# Patient Record
Sex: Female | Born: 1950 | Race: White | Hispanic: No | Marital: Married | Smoking: Never smoker
Health system: Southern US, Community
[De-identification: ages and names within clinical notes are randomized; demographics above are authoritative.]

## PROBLEM LIST (undated history)

## (undated) DIAGNOSIS — E559 Vitamin D deficiency, unspecified: Secondary | ICD-10-CM

## (undated) DIAGNOSIS — E785 Hyperlipidemia, unspecified: Secondary | ICD-10-CM

## (undated) DIAGNOSIS — I10 Essential (primary) hypertension: Secondary | ICD-10-CM

## (undated) DIAGNOSIS — K635 Polyp of colon: Secondary | ICD-10-CM

## (undated) DIAGNOSIS — F32A Depression, unspecified: Secondary | ICD-10-CM

## (undated) DIAGNOSIS — F39 Unspecified mood [affective] disorder: Secondary | ICD-10-CM

## (undated) DIAGNOSIS — R3129 Other microscopic hematuria: Secondary | ICD-10-CM

## (undated) DIAGNOSIS — Z8669 Personal history of other diseases of the nervous system and sense organs: Secondary | ICD-10-CM

## (undated) DIAGNOSIS — J45909 Unspecified asthma, uncomplicated: Secondary | ICD-10-CM

## (undated) DIAGNOSIS — G609 Hereditary and idiopathic neuropathy, unspecified: Secondary | ICD-10-CM

## (undated) DIAGNOSIS — G4730 Sleep apnea, unspecified: Secondary | ICD-10-CM

## (undated) DIAGNOSIS — M1990 Unspecified osteoarthritis, unspecified site: Secondary | ICD-10-CM

## (undated) DIAGNOSIS — E119 Type 2 diabetes mellitus without complications: Secondary | ICD-10-CM

## (undated) DIAGNOSIS — Z87898 Personal history of other specified conditions: Secondary | ICD-10-CM

## (undated) DIAGNOSIS — G43909 Migraine, unspecified, not intractable, without status migrainosus: Secondary | ICD-10-CM

## (undated) DIAGNOSIS — J309 Allergic rhinitis, unspecified: Secondary | ICD-10-CM

## (undated) DIAGNOSIS — D649 Anemia, unspecified: Secondary | ICD-10-CM

## (undated) DIAGNOSIS — F329 Major depressive disorder, single episode, unspecified: Secondary | ICD-10-CM

## (undated) HISTORY — PX: ABDOMINAL HYSTERECTOMY: SHX81

## (undated) HISTORY — DX: Depression, unspecified: F32.A

## (undated) HISTORY — DX: Major depressive disorder, single episode, unspecified: F32.9

## (undated) HISTORY — DX: Essential (primary) hypertension: I10

## (undated) HISTORY — DX: Hyperlipidemia, unspecified: E78.5

## (undated) HISTORY — DX: Type 2 diabetes mellitus without complications: E11.9

## (undated) HISTORY — DX: Unspecified asthma, uncomplicated: J45.909

## (undated) HISTORY — PX: CARPAL TUNNEL RELEASE: SHX101

## (undated) HISTORY — DX: Sleep apnea, unspecified: G47.30

## (undated) HISTORY — DX: Unspecified osteoarthritis, unspecified site: M19.90

## (undated) HISTORY — PX: CATARACT EXTRACTION: SUR2

---

## 2004-06-12 ENCOUNTER — Ambulatory Visit: Payer: Self-pay | Admitting: Obstetrics and Gynecology

## 2005-04-06 ENCOUNTER — Ambulatory Visit: Payer: Self-pay | Admitting: Unknown Physician Specialty

## 2005-06-20 ENCOUNTER — Ambulatory Visit: Payer: Self-pay | Admitting: Ophthalmology

## 2005-07-02 ENCOUNTER — Ambulatory Visit: Payer: Self-pay | Admitting: Obstetrics and Gynecology

## 2005-10-16 ENCOUNTER — Ambulatory Visit: Payer: Self-pay | Admitting: Unknown Physician Specialty

## 2005-10-16 HISTORY — PX: COLONOSCOPY: SHX174

## 2005-12-26 ENCOUNTER — Ambulatory Visit: Payer: Self-pay | Admitting: Internal Medicine

## 2006-03-06 ENCOUNTER — Ambulatory Visit: Payer: Self-pay | Admitting: Internal Medicine

## 2006-07-04 ENCOUNTER — Ambulatory Visit: Payer: Self-pay | Admitting: Obstetrics and Gynecology

## 2007-05-26 ENCOUNTER — Ambulatory Visit: Payer: Self-pay | Admitting: Internal Medicine

## 2007-09-04 ENCOUNTER — Ambulatory Visit: Payer: Self-pay | Admitting: Obstetrics and Gynecology

## 2008-09-08 ENCOUNTER — Ambulatory Visit: Payer: Self-pay | Admitting: Obstetrics and Gynecology

## 2009-04-21 ENCOUNTER — Ambulatory Visit: Payer: Self-pay | Admitting: Neurology

## 2009-05-06 ENCOUNTER — Ambulatory Visit: Payer: Self-pay | Admitting: Obstetrics and Gynecology

## 2009-05-09 ENCOUNTER — Inpatient Hospital Stay: Payer: Self-pay | Admitting: Obstetrics and Gynecology

## 2009-09-15 ENCOUNTER — Ambulatory Visit: Payer: Self-pay | Admitting: Obstetrics and Gynecology

## 2010-03-06 ENCOUNTER — Ambulatory Visit: Payer: Self-pay | Admitting: Unknown Physician Specialty

## 2010-05-17 ENCOUNTER — Ambulatory Visit: Payer: Self-pay | Admitting: Ophthalmology

## 2010-05-29 ENCOUNTER — Ambulatory Visit: Payer: Self-pay | Admitting: Ophthalmology

## 2010-12-19 ENCOUNTER — Ambulatory Visit: Payer: Self-pay | Admitting: Obstetrics and Gynecology

## 2011-02-12 ENCOUNTER — Emergency Department: Payer: Self-pay | Admitting: Emergency Medicine

## 2011-07-12 LAB — COMPREHENSIVE METABOLIC PANEL
Albumin: 3.9 g/dL (ref 3.4–5.0)
Anion Gap: 15 (ref 7–16)
BUN: 18 mg/dL (ref 7–18)
Bilirubin,Total: 0.3 mg/dL (ref 0.2–1.0)
Calcium, Total: 9.6 mg/dL (ref 8.5–10.1)
Co2: 25 mmol/L (ref 21–32)
Creatinine: 0.87 mg/dL (ref 0.60–1.30)
EGFR (African American): >60
Glucose: 102 mg/dL — ABNORMAL HIGH (ref 65–99)
Osmolality: 289 (ref 275–301)
Potassium: 3.4 mmol/L — ABNORMAL LOW (ref 3.5–5.1)
SGPT (ALT): 20 U/L
Sodium: 144 mmol/L (ref 136–145)
Total Protein: 7.6 g/dL (ref 6.4–8.2)

## 2011-07-12 LAB — CBC
MCH: 31 pg (ref 26.0–34.0)
MCHC: 33.4 g/dL (ref 32.0–36.0)
Platelet: 200 10*3/uL (ref 150–440)
RBC: 4.27 10*6/uL (ref 3.80–5.20)

## 2011-07-12 LAB — TROPONIN I: Troponin-I: <0.02 ng/mL

## 2011-07-13 ENCOUNTER — Observation Stay: Payer: Self-pay | Admitting: Internal Medicine

## 2011-07-13 LAB — TROPONIN I
Troponin-I: <0.02 ng/mL
Troponin-I: <0.02 ng/mL

## 2012-02-22 ENCOUNTER — Ambulatory Visit: Payer: Self-pay | Admitting: Internal Medicine

## 2012-03-14 ENCOUNTER — Ambulatory Visit: Payer: Self-pay | Admitting: Internal Medicine

## 2012-04-03 ENCOUNTER — Ambulatory Visit: Payer: Self-pay | Admitting: Obstetrics and Gynecology

## 2013-05-29 ENCOUNTER — Ambulatory Visit: Payer: Self-pay | Admitting: Obstetrics and Gynecology

## 2014-03-09 DIAGNOSIS — E118 Type 2 diabetes mellitus with unspecified complications: Secondary | ICD-10-CM | POA: Insufficient documentation

## 2014-03-09 DIAGNOSIS — E7800 Pure hypercholesterolemia, unspecified: Secondary | ICD-10-CM | POA: Insufficient documentation

## 2014-03-09 DIAGNOSIS — I10 Essential (primary) hypertension: Secondary | ICD-10-CM | POA: Insufficient documentation

## 2014-08-15 NOTE — H&P (Signed)
PATIENT NAME:  Desiree Ramirez, Desiree Ramirez MR#:  [id number] DATE OF BIRTH:  [birth year]  DATE OF ADMISSION:  07/13/2011  PRIMARY CARE PHYSICIAN: Dr. Daniel Nones.   CHIEF COMPLAINT: Chest pain.   HISTORY OF PRESENT ILLNESS: [name] is a 64 year old pleasant Caucasian female with a history of systemic hypertension, hypercholesterolemia and diabetes mellitus. The patient was in her usual state of health until about a week ago when she started to feel some chest discomfort upon exertion, but was mild and brief. She did not pay much attention until the day of presentation to the Emergency Department stating that she was carrying her grandchild and walking in order that was called and at that point, she felt shortness of breath and midsternal chest pain described as constant feeling of squeezing-like or cramping, the severity about 7 in a scale of 10. This was not associated with any sweating, nausea or vomiting. The duration of the pain was about four hours and started to ease off gradually and then subsided by the time she was being evaluated at the Emergency Department. There was no associated syncope or near syncope. Given the patient's risk factors and also strong family history of premature coronary artery disease, she was admitted for observation and to follow-up on her cardiac enzymes.   REVIEW OF SYSTEMS: CONSTITUTIONAL: Denies any fever. No chills. No night sweats. No fatigue. EYES: No blurring of vision. No double vision. ENT: No hearing impairment. No sore throat. No dysphagia. CARDIOVASCULAR: Admits having chest pain and shortness of breath. No edema. No syncope. RESPIRATORY: No cough. No sputum production, but she admits the chest pain and the shortness of breath. GASTROINTESTINAL: No abdominal pain. No nausea. No vomiting. No diarrhea. GENITOURINARY: No dysuria or frequency of urination. No vaginal bleed. MUSCULOSKELETAL: No joint swelling or pain. No muscular pain or swelling. INTEGUMENTARY: No skin rash.  No ulcers. NEUROLOGY: No focal weakness. No seizure activity. No headache. PSYCHIATRY: Currently, she does not have anxiety or depression, but she has by history. ENDOCRINE: No night sweats. No polyuria or polydipsia. No heat or cold intolerance. HEMATOLOGY: No easy bruisability. No lymphadenopathies.   PAST MEDICAL HISTORY:  1. History of systemic hypertension.  2. History of hypercholesterolemia.  3. History of asthma.  4. Diabetes mellitus, type II.  5. History of anxiety.  6. History of diverticulosis and colon polyps.   PAST SURGICAL HISTORY:  1. History of hysterectomy in 2011.  2. History of colonoscopy in 2011.  3. History of bilateral cataract surgery.   FAMILY HISTORY: Her mother died at the age of 52 from heart attack. She has a brother who had a heart attack at age of 70.   SOCIAL HISTORY: She is married, living with her husband. She worked at [employer], then she was laid off ann now she is unemployed.   SOCIAL HABITS: Nonsmoker. She drinks alcohol only socially.   ADMISSION MEDICATIONS:  1. Seroquel 25 mg at night.  2. Clonazepam 0.5 mg twice a day p.r.n.  3. Simvastatin 80 milligrams once a day.  4. Sertraline or Zoloft 100 mg a day.  5. Vitamin D3 2000 units once a day.  6. ProAir HFA inhaler p.r.n.  7. Advair Diskus 250/50 twice a day. 8. Metformin 500 mg once a day. 9. Losartan/hydrochlorothiazide 50/12.5 once a day.  10. Estradiol 2 mg once a day.   ALLERGIES: Trimox causing rash and hives. The patient reported that she has no allergy to penicillin and she had tolerated that well in the  past.   PHYSICAL EXAMINATION:  VITAL SIGNS: Blood pressure 154/79, respiratory rate 20, pulse 58, temperature 97.6, and oxygen saturation is 99% on oxygen.   GENERAL APPEARANCE: Middle-aged female lying in bed in no acute distress.   HEAD AND NECK: No pallor. No icterus. No cyanosis.   ENT: Hearing was normal. Nasal mucosa, lips, tongue were normal.   EYES:  Normal iris and conjunctivae. Pupils about 4 mm, equal and sluggishly reactive to light.   NECK: Supple. Trachea at midline. No thyromegaly. No cervical lymphadenopathy.   HEART: Normal S1, S2. No S3, S4. No murmur. No gallop. No carotid bruits.   RESPIRATORY: Normal breathing pattern without use of accessory muscles. No rales. No wheezing.   ABDOMEN: Soft without tenderness. No hepatosplenomegaly. No masses. No hernias.   MUSCULOSKELETAL: No joint swelling. No clubbing.   NEUROLOGIC: Cranial nerves II through XII are intact. No focal motor deficit.   PSYCHIATRY: The patient is alert and oriented x3. Mood and affect were normal.   LABORATORY, DIAGNOSTIC, AND RADIOLOGICAL DATA: EKG showed normal sinus rhythm at rate of 60 per minute. Nonspecific T and ST abnormalities. Incomplete right bundle branch block. Otherwise unremarkable EKG. Serum glucose 102, BUN 18, creatinine 0.8, sodium 144, potassium 3.4. Her liver function tests were normal. Troponin less than 0.02. CBC showed white count of 8,000, hemoglobin 13, hematocrit 39, platelet count 200. Chest x-ray showed heart size was normal. No consolidation, no effusion.   ASSESSMENT:  1. Chest pain, for evaluation. This is suspicious for unstable angina. With multiple risk factors including also strong family history.  2. Systemic hypertension.  3. Hypercholesterolemia.  4. Diabetes mellitus, type II.  5. History of asthma.  6. History of anxiety and depression.  7. Mild hypokalemia.   PLAN:  1. The patient will be admitted for observation on telemetry.  2. Follow-up on cardiac enzymes.  3. Continue medications as listed above and add aspirin 325 mg a day.  4. Sublingual nitroglycerin p.r.n.  5. Schedule for nuclear stress test.  6. One dose of potassium chloride 40 mEq x1 to correct the mild hypokalemia.   TIME NEEDED TO EVALUATE THIS PATIENT: More than 50 minutes.   ____________________________ Carney CornersAmir M. Rudene Rearwish,  MD amd:ap D: 07/13/2011 01:52:37 ET T: 07/13/2011 07:04:39 ET JOB#: [id number]  cc: Carney CornersAmir M. Rudene Rearwish, MD, <Dictator> Lynnea FerrierBert J. Klein III, MD Karolee OhsAMIR Dala DockM Qasim Diveley MD ELECTRONICALLY SIGNED 07/13/2011 22:14

## 2015-04-01 ENCOUNTER — Encounter: Payer: BLUE CROSS/BLUE SHIELD | Attending: Surgery | Admitting: Surgery

## 2015-04-01 DIAGNOSIS — M1990 Unspecified osteoarthritis, unspecified site: Secondary | ICD-10-CM | POA: Insufficient documentation

## 2015-04-01 DIAGNOSIS — E11622 Type 2 diabetes mellitus with other skin ulcer: Secondary | ICD-10-CM | POA: Diagnosis present

## 2015-04-01 DIAGNOSIS — B354 Tinea corporis: Secondary | ICD-10-CM | POA: Insufficient documentation

## 2015-04-01 DIAGNOSIS — Z87891 Personal history of nicotine dependence: Secondary | ICD-10-CM | POA: Diagnosis not present

## 2015-04-01 DIAGNOSIS — F329 Major depressive disorder, single episode, unspecified: Secondary | ICD-10-CM | POA: Insufficient documentation

## 2015-04-01 DIAGNOSIS — I10 Essential (primary) hypertension: Secondary | ICD-10-CM | POA: Insufficient documentation

## 2015-04-01 DIAGNOSIS — F419 Anxiety disorder, unspecified: Secondary | ICD-10-CM | POA: Insufficient documentation

## 2015-04-01 DIAGNOSIS — L97222 Non-pressure chronic ulcer of left calf with fat layer exposed: Secondary | ICD-10-CM | POA: Insufficient documentation

## 2015-04-01 DIAGNOSIS — J45909 Unspecified asthma, uncomplicated: Secondary | ICD-10-CM | POA: Diagnosis not present

## 2015-04-01 DIAGNOSIS — G4730 Sleep apnea, unspecified: Secondary | ICD-10-CM | POA: Insufficient documentation

## 2015-04-02 NOTE — Progress Notes (Addendum)
Desiree Ramirez, Desiree Ramirez ([id number]) Visit Report for 04/01/2015 Chief Complaint Document Details Patient Name: Desiree Ramirez, Desiree Ramirez 04/01/2015 10:15 Date of Service: AM Medical Record [id number] Number: Patient Account Number: [id number] May 28, 1950 (63 y.o. Treating RN: Phillis Haggis Date of Birth/Sex: Female) Other Clinician: Primary Care Physician: Desiree Ramirez Nones Treating Evlyn Kanner Referring Physician: RATCLIFFE, HEATHER Physician/Extender: Tania Ade in Treatment: 0 Information Obtained from: Patient Chief Complaint Patient presents to the wound care center for a consult due non healing wound to the left lower extremity which she's had for about 6 months now. Electronic Signature(s) Signed: 04/01/2015 12:03:53 PM By: Evlyn Kanner MD, FACS Entered By: Evlyn Kanner on 04/01/2015 12:03:53 Desiree Ramirez ([id number]) -------------------------------------------------------------------------------- HPI Details Patient Name: Desiree Ramirez, Desiree Ramirez 04/01/2015 10:15 Date of Service: AM Medical Record [id number] Number: Patient Account Number: [id number] [birth year] (64 y.o. Treating RN: Phillis Haggis Date of Birth/Sex: Female) Other Clinician: Primary Care Physician: Desiree Ramirez Nones Treating Evlyn Kanner Referring Physician: RATCLIFFE, HEATHER Physician/Extender: Weeks in Treatment: 0 History of Present Illness Location: left lower extremity Quality: Patient reports No Pain. Severity: Patient states wound are getting worse. Duration: Patient has had the wound for > 5 months prior to seeking treatment at the wound center Context: The wound appeared gradually over time. she thought initially she had a bump and a slight abrasion in this area. Modifying Factors: Other treatment(s) tried include:was treated with Bactrim and also been treated with Diflucan orally for a fungal infection in a perineural region Associated Signs and Symptoms: the patient says she has a lot of itching and scratching  around her perineal region. HPI Description: 64 year old patient who is known to have diabetes mellitus for several years now comes with a history of having a blunt injury to her left lower extremity in June 2016. The wound never healed and she continues to have local problems. She was seen by her PCP recently and given symptomatic treatment with Bactrim DS and was also being treated for candidiasis her perineal region. Under medical care the wound progressed to have further issues and hence she was referred to [place]. Electronic Signature(s) Signed: 04/01/2015 12:05:22 PM By: Evlyn Kanner MD, FACS Previous Signature: 04/01/2015 10:59:41 AM Version By: Evlyn Kanner MD, FACS Entered By: Evlyn Kanner on 04/01/2015 12:05:22 Desiree Ramirez ([id number]) -------------------------------------------------------------------------------- Physical Exam Details Patient Name: Desiree Ramirez, Desiree Ramirez 04/01/2015 10:15 Date of Service: AM Medical Record [id number] Number: Patient Account Number: [id number] 20-Jul-1950 (64 y.o. Treating RN: Phillis Haggis Date of Birth/Sex: Female) Other Clinician: Primary Care Physician: Desiree Ramirez Nones Treating Desiree Ramirez Referring Physician: RATCLIFFE, HEATHER Physician/Extender: Weeks in Treatment: 0 Constitutional . Pulse regular. Respirations normal and unlabored. Afebrile. . Eyes Nonicteric. Reactive to light. Ears, Nose, Mouth, and Throat Lips, teeth, and gums WNL.Marland Kitchen Moist mucosa without lesions . Neck supple and nontender. No palpable supraclavicular or cervical adenopathy. Normal sized without goiter. Respiratory WNL. No retractions.. Cardiovascular Pedal Pulses WNL. her ABI was 1.01. No clubbing, cyanosis or edema. Lymphatic No adneopathy. No adenopathy. No adenopathy. Musculoskeletal Adexa without tenderness or enlargement.. Digits and nails w/o clubbing, cyanosis, infection, petechiae, ischemia, or inflammatory conditions.. Integumentary (Hair,  Skin) No suspicious lesions. No crepitus or fluctuance. No peri-wound warmth or erythema. No masses.Marland Kitchen Psychiatric Judgement and insight Intact.. No evidence of depression, anxiety, or agitation.. Notes on the left lower extremity in the region of fashion she has a circular lesion which is typical for tinea corporis. I do not find any open ulceration Electronic Signature(s) Signed: 04/01/2015 12:08:53 PM By: Evlyn Kanner MD, FACS Previous Signature: 04/01/2015  12:06:01 PM Version By: Evlyn KannerBritto, Cleotis Sparr MD, FACS Entered By: Evlyn KannerBritto, Kellis Topete on 04/01/2015 12:08:52 Desiree Ramirez, Desiree M. ([id number]) -------------------------------------------------------------------------------- Physician Orders Details Patient Name: Desiree Ramirez, Desiree M. 04/01/2015 10:15 Date of Service: AM Medical Record [id number] Number: Patient Account Number: [id number] October 19, 1950 (64 y.o. Treating RN: Phillis HaggisPinkerton, Desiree Ramirez Date of Birth/Sex: Female) Other Clinician: Primary Care Physician: Desiree Ramirez NonesKLEIN, BERT Treating Evlyn KannerBritto, Antonea Gaut Referring Physician: RATCLIFFE, HEATHER Physician/Extender: Tania AdeWeeks in Treatment: 0 Verbal / Phone Orders: Yes ClinicianAshok Cordia: Desiree Ramirez, Desiree Ramirez Read Back and Verified: Yes Diagnosis Coding ICD-10 Coding Code Description E11.622 Type 2 diabetes mellitus with other skin ulcer L97.222 Non-pressure chronic ulcer of left calf with fat layer exposed Wound Cleansing Wound #1 Left,Anterior Lower Leg o Clean wound with Normal Saline. Anesthetic Wound #1 Left,Anterior Lower Leg o Topical Lidocaine 4% cream applied to wound bed prior to debridement Follow-up Appointments Wound #1 Left,Anterior Lower Leg o Return Appointment in 1 week. Patient Medications Allergies: amocicillin Notifications Medication Indication Start End Lamisil AT 04/01/2015 DOSE topical 1 % cream - cream topical apply once daily Electronic Signature(s) Signed: 04/01/2015 12:02:33 PM By: Evlyn KannerBritto, Amirr Achord MD, FACS Entered By: Evlyn KannerBritto, Anes Rigel on  04/01/2015 12:02:32 Desiree Ramirez, Desiree M. ([id number]) -------------------------------------------------------------------------------- Problem List Details Patient Name: Desiree Ramirez, Desiree M. 04/01/2015 10:15 Date of Service: AM Medical Record [id number] Number: Patient Account Number: [id number] October 19, 1950 (64 y.o. Treating RN: Phillis HaggisPinkerton, Desiree Ramirez Date of Birth/Sex: Female) Other Clinician: Primary Care Physician: Desiree Ramirez NonesKLEIN, BERT Treating Evlyn KannerBritto, Rhiannan Kievit Referring Physician: RATCLIFFE, HEATHER Physician/Extender: Tania AdeWeeks in Treatment: 0 Active Problems ICD-10 Encounter Code Description Active Date Diagnosis E11.622 Type 2 diabetes mellitus with other skin ulcer 04/01/2015 Yes B35.4 Tinea corporis 04/01/2015 Yes Inactive Problems Resolved Problems Electronic Signature(s) Signed: 04/01/2015 12:03:29 PM By: Evlyn KannerBritto, Kahli Fitzgerald MD, FACS Previous Signature: 04/01/2015 11:00:31 AM Version By: Evlyn KannerBritto, Gray Doering MD, FACS Entered By: Evlyn KannerBritto, Darothy Courtright on 04/01/2015 12:03:29 Desiree Ramirez, Pennelope M. ([id number]) -------------------------------------------------------------------------------- Progress Note Details Patient Name: Desiree Ramirez, Desiree Ramirez M. 04/01/2015 10:15 Date of Service: AM Medical Record [id number] Number: Patient Account Number: [id number] October 19, 1950 (64 y.o. Treating RN: Phillis HaggisPinkerton, Desiree Ramirez Date of Birth/Sex: Female) Other Clinician: Primary Care Physician: Desiree Ramirez NonesKLEIN, BERT Treating Evlyn KannerBritto, Shanda Cadotte Referring Physician: RATCLIFFE, HEATHER Physician/Extender: Tania AdeWeeks in Treatment: 0 Subjective Chief Complaint Information obtained from Patient Patient presents to the wound care center for a consult due non healing wound to the left lower extremity which she's had for about 6 months now. History of Present Illness (HPI) The following HPI elements were documented for the patient's wound: Location: left lower extremity Quality: Patient reports No Pain. Severity: Patient states wound are getting worse. Duration: Patient  has had the wound for > 5 months prior to seeking treatment at the wound center Context: The wound appeared gradually over time. she thought initially she had a bump and a slight abrasion in this area. Modifying Factors: Other treatment(s) tried include:was treated with Bactrim and also been treated with Diflucan orally for a fungal infection in a perineural region Associated Signs and Symptoms: the patient says she has a lot of itching and scratching around her perineal region. 64 year old patient who is known to have diabetes mellitus for several years now comes with a history of having a blunt injury to her left lower extremity in June 2016. The wound never healed and she continues to have local problems. She was seen by her PCP recently and given symptomatic treatment with Bactrim DS and was also being treated for candidiasis her perineal region. Under medical care the wound progressed to have further issues and hence she was referred to us. Wound  History Patient presents with 1 open wound that has been present for approximately 6 months. Patient has been treating wound in the following manner: bactine, abt oint, castor oil. Laboratory tests have not been performed in the last month. Patient reportedly has not tested positive for an antibiotic resistant organism. Patient reportedly has not tested positive for osteomyelitis. Patient reportedly has not had testing performed to evaluate circulation in the legs. Patient experiences the following problems associated with their wounds: swelling. Patient History Information obtained from Patient. DARCEL, ZICK ([id number]) Allergies amocicillin (Severity: Severe, Reaction: hives) Family History Cancer - Siblings, Paternal Grandparents, Maternal Grandparents, Diabetes - Mother, Siblings, Heart Disease - Mother, Siblings, Hypertension - Mother, Mother, Father, Siblings, Lung Disease - Maternal Grandparents, Stroke - Maternal Grandparents,  Thyroid Problems - Child, No family history of Hereditary Spherocytosis, Kidney Disease, Seizures, Tuberculosis. Social History Former smoker - 6 months, Marital Status - Married, Alcohol Use - Rarely, Drug Use - No History, Caffeine Use - Daily. Medical History Eyes Patient has history of Cataracts - surgery Denies history of Glaucoma Respiratory Patient has history of Asthma, Sleep Apnea Cardiovascular Patient has history of Hypertension Endocrine Patient has history of Type II Diabetes Musculoskeletal Patient has history of Osteoarthritis Patient is treated with Oral Agents. Blood sugar is not tested. Medical And Surgical History Notes Genitourinary recurrent UTIs cyctitis Psychiatric depression Review of Systems (ROS) Eyes Complains or has symptoms of Glasses / Contacts - glasses. Ear/Nose/Mouth/Throat The patient has no complaints or symptoms. Hematologic/Lymphatic The patient has no complaints or symptoms. Respiratory The patient has no complaints or symptoms. Gastrointestinal The patient has no complaints or symptoms. Immunological Complains or has symptoms of Hives, Itching. Integumentary (Skin) Complains or has symptoms of Wounds, Swelling. Neurologic SOFHIA, ULIBARRI ([id number]) The patient has no complaints or symptoms. Oncologic The patient has no complaints or symptoms. Psychiatric Complains or has symptoms of Anxiety. Medications losartan 50 mg-hydrochlorothiazide 12.5 mg tablet oral 1 1 tablet oral clonazepam 0.5 mg tablet oral 1 1 tablet oral metformin 500 mg tablet oral 1 1 tablet oral simvastatin 80 mg tablet oral 1 1 tablet oral Seroquel 25 mg tablet oral 1 1 tablet oral ProAir HFA 90 mcg/actuation aerosol inhaler inhalation HFA aerosol inhaler inhalation Advair Diskus 250 mcg-50 mcg/dose powder for inhalation inhalation blister with device inhalation estradiol 2 mg tablet oral 1 1 tablet oral sertraline 100 mg tablet oral 1 1 tablet  oral Lamisil AT 1 % topical cream topical cream topical apply once daily Vitamin D3 2,000 unit tablet oral 1 1 tablet oral Objective Constitutional Pulse regular. Respirations normal and unlabored. Afebrile. Vitals Time Taken: 11:07 AM, Height: 65 in, Source: Stated, Weight: 180 lbs, Source: Stated, BMI: 30, Temperature: 97.8 F, Pulse: 75 bpm, Respiratory Rate: 20 breaths/min, Blood Pressure: 109/87 mmHg. Eyes Nonicteric. Reactive to light. Ears, Nose, Mouth, and Throat Lips, teeth, and gums WNL.Marland Kitchen Moist mucosa without lesions . Neck supple and nontender. No palpable supraclavicular or cervical adenopathy. Normal sized without goiter. Respiratory WNL. No retractions.. Cardiovascular Pedal Pulses WNL. her ABI was 1.01. No clubbing, cyanosis or edema. LATALIA, ETZLER ([id number]) Lymphatic No adneopathy. No adenopathy. No adenopathy. Musculoskeletal Adexa without tenderness or enlargement.. Digits and nails w/o clubbing, cyanosis, infection, petechiae, ischemia, or inflammatory conditions.Marland Kitchen Psychiatric Judgement and insight Intact.. No evidence of depression, anxiety, or agitation.. General Notes: on the left lower extremity in the region of fashion she has a circular lesion which is typical for tinea corporis. I do not find any open ulceration Integumentary (  Hair, Skin) No suspicious lesions. No crepitus or fluctuance. No peri-wound warmth or erythema. No masses.. Wound #1 status is Open. Original cause of wound was Trauma. The wound is located on the Left,Anterior Lower Leg. The wound measures 4cm length x 2.5cm width x 0.1cm depth; 7.854cm^2 area and 0.785cm^3 volume. The wound is limited to skin breakdown. There is no tunneling or undermining noted. There is a none present amount of drainage noted. The wound margin is flat and intact. There is large (67- 100%) red granulation within the wound bed. There is a small (1-33%) amount of necrotic tissue within the wound bed  including Adherent Slough. The periwound skin appearance exhibited: Localized Edema, Dry/Scaly. Periwound temperature was noted as No Abnormality. The periwound has tenderness on palpation. Assessment Active Problems ICD-10 E11.622 - Type 2 diabetes mellitus with other skin ulcer B35.4 - Tinea corporis This 64 year old diabetic patient who may have fungal infection elsewhere now has a typical circular area of tinea corporis on her left lower extremity. I do not find any evidence of any ulceration at this place and I have empirically treated her with Lamisil AT to be applied daily for a week. she will also be in touch with her PCP regarding further instructions and I will see her back in a week's time to see if there is any ulceration before discharging her. FATMA, RUTTEN ([id number]) Plan Wound Cleansing: Wound #1 Left,Anterior Lower Leg: Clean wound with Normal Saline. Anesthetic: Wound #1 Left,Anterior Lower Leg: Topical Lidocaine 4% cream applied to wound bed prior to debridement Follow-up Appointments: Wound #1 Left,Anterior Lower Leg: Return Appointment in 1 week. The following medication(s) was prescribed: Lamisil AT topical 1 % cream cream topical apply once daily starting 04/01/2015 This 64 year old diabetic patient who may have fungal infection elsewhere now has a typical circular area of tinea corporis on her left lower extremity. I do not find any evidence of any ulceration at this place and I have empirically treated her with Lamisil AT to be applied daily for a week. she will also be in touch with her PCP regarding further instructions and I will see her back in a week's time to see if there is any ulceration before discharging her. Electronic Signature(s) Signed: 04/01/2015 12:09:06 PM By: Evlyn Kanner MD, FACS Previous Signature: 04/01/2015 12:07:54 PM Version By: Evlyn Kanner MD, FACS Entered By: Evlyn Kanner on 04/01/2015 12:09:06 Desiree Ramirez  ([id number]) -------------------------------------------------------------------------------- ROS/PFSH Details Patient Name: TASHARA, SUDER 04/01/2015 10:15 Date of Service: AM Medical Record [id number] Number: Patient Account Number: [id number] 1951-02-05 (64 y.o. Treating RN: Phillis Haggis Date of Birth/Sex: Female) Other Clinician: Primary Care Physician: Desiree Ramirez Nones Treating Kainat Pizana Referring Physician: RATCLIFFE, HEATHER Physician/Extender: Tania Ade in Treatment: 0 Information Obtained From Patient Wound History Do you currently have one or more open woundso Yes How many open wounds do you currently haveo 1 Approximately how long have you had your woundso 6 months How have you been treating your wound(s) until nowo bactine, abt oint, castor oil Has your wound(s) ever healed and then re-openedo No Have you had any lab work done in the past montho No Have you tested positive for an antibiotic resistant organism (MRSA, No VRE)o Have you tested positive for osteomyelitis (bone infection)o No Have you had any tests for circulation on your legso No Have you had other problems associated with your woundso Swelling Eyes Complaints and Symptoms: Positive for: Glasses / Contacts - glasses Medical History: Positive for: Cataracts - surgery Negative for: Glaucoma  Immunological Complaints and Symptoms: Positive for: Hives; Itching Integumentary (Skin) Complaints and Symptoms: Positive for: Wounds; Swelling Psychiatric Complaints and Symptoms: Positive for: Anxiety AIRYANA, SPRUNGER ([id number]) Medical History: Past Medical History Notes: depression Ear/Nose/Mouth/Throat Complaints and Symptoms: No Complaints or Symptoms Hematologic/Lymphatic Complaints and Symptoms: No Complaints or Symptoms Respiratory Complaints and Symptoms: No Complaints or Symptoms Medical History: Positive for: Asthma; Sleep Apnea Cardiovascular Medical History: Positive for:  Hypertension Gastrointestinal Complaints and Symptoms: No Complaints or Symptoms Endocrine Medical History: Positive for: Type II Diabetes Time with diabetes: 8 yrs Treated with: Oral agents Blood sugar tested every day: No Genitourinary Medical History: Past Medical History Notes: recurrent UTIs cyctitis Musculoskeletal Medical History: Positive for: Osteoarthritis DARIANE, NATZKE ([id number]) Neurologic Complaints and Symptoms: No Complaints or Symptoms Oncologic Complaints and Symptoms: No Complaints or Symptoms HBO Extended History Items Eyes: Cataracts Family and Social History Cancer: Yes - Siblings, Paternal Grandparents, Maternal Grandparents; Diabetes: Yes - Mother, Siblings; Heart Disease: Yes - Mother, Siblings; Hereditary Spherocytosis: No; Hypertension: Yes - Mother, Mother, Father, Siblings; Kidney Disease: No; Lung Disease: Yes - Maternal Grandparents; Seizures: No; Stroke: Yes - Maternal Grandparents; Thyroid Problems: Yes - Child; Tuberculosis: No; Former smoker - 6 months; Marital Status - Married; Alcohol Use: Rarely; Drug Use: No History; Caffeine Use: Daily; Financial Concerns: No; Food, Clothing or Shelter Needs: No; Support System Lacking: No; Transportation Concerns: No; Advanced Directives: No; Patient does not want information on Advanced Directives; Do not resuscitate: No; Living Will: Yes (Not Provided); Medical Power of Attorney: No Electronic Signature(s) Signed: 04/01/2015 4:13:31 PM By: Evlyn Kanner MD, FACS Signed: 04/05/2015 3:59:50 PM By: Alejandro Mulling Entered By: Alejandro Mulling on 04/01/2015 11:33:07 Desiree Ramirez ([id number]) -------------------------------------------------------------------------------- SuperBill Details Patient Name: Desiree Ramirez. Date of Service: 04/01/2015 Medical Record Number: [id number] Patient Account Number: [id number] Date of Birth/Sex: 12/28/1950 (64 y.o. Female) Treating RN: Phillis Haggis Primary Care Physician: Desiree Ramirez Nones Other Clinician: Referring Physician: RATCLIFFE, HEATHER Treating Physician/Extender: Rudene Re in Treatment: 0 Diagnosis Coding ICD-10 Codes Code Description E11.622 Type 2 diabetes mellitus with other skin ulcer B35.4 Tinea corporis Facility Procedures CPT4 Code: [id number] Description: 99213 - WOUND CARE VISIT-LEV 3 EST PT Modifier: Quantity: 1 Physician Procedures CPT4 Code: 3244010 Description: WC PHYS LEVEL 3 o NEW PT ICD-10 Description Diagnosis E11.622 Type 2 diabetes mellitus with other skin ulc B35.4 Tinea corporis Modifier: er Quantity: 1 Electronic Signature(s) Signed: 04/01/2015 12:08:25 PM By: Evlyn Kanner MD, FACS Entered By: Evlyn Kanner on 04/01/2015 12:08:25

## 2015-04-06 NOTE — Progress Notes (Signed)
Desiree, Ramirez ([id number]) Visit Report for 04/01/2015 Allergy List Details Patient Name: Desiree Ramirez, Desiree Ramirez. Date of Service: 04/01/2015 10:15 AM Medical Record Number: [id number] Patient Account Number: [id number] Date of Birth/Sex: 1951-01-08 (64 y.o. Female) Treating RN: Phillis Haggis Primary Care Physician: Daniel Nones Other Clinician: Referring Physician: RATCLIFFE, HEATHER Treating Physician/Extender: Rudene Re in Treatment: 0 Allergies Active Allergies amocicillin Reaction: hives Severity: Severe Allergy Notes Electronic Signature(s) Signed: 04/05/2015 3:59:50 PM By: Alejandro Mulling Entered By: Alejandro Mulling on 04/01/2015 11:23:53 Desiree Ramirez ([id number]) -------------------------------------------------------------------------------- Arrival Information Details Patient Name: Desiree Ramirez. Date of Service: 04/01/2015 10:15 AM Medical Record Number: [id number] Patient Account Number: [id number] Date of Birth/Sex: 09/15/1950 (64 y.o. Female) Treating RN: Phillis Haggis Primary Care Physician: Daniel Nones Other Clinician: Referring Physician: RATCLIFFE, HEATHER Treating Physician/Extender: Rudene Re in Treatment: 0 Visit Information Patient Arrived: Ambulatory Arrival Time: 11:03 Accompanied By: husband Transfer Assistance: None Patient Identification Verified: Yes Secondary Verification Process Yes Completed: Patient Requires Transmission-Based No Precautions: Patient Has Alerts: Yes Patient Alerts: DM II Aspirin Electronic Signature(s) Signed: 04/05/2015 3:59:50 PM By: Alejandro Mulling Entered By: Alejandro Mulling on 04/01/2015 11:06:47 Desiree Ramirez ([id number]) -------------------------------------------------------------------------------- Clinic Level of Care Assessment Details Patient Name: Desiree Ramirez. Date of Service: 04/01/2015 10:15 AM Medical Record Number: [id number] Patient Account Number:  [id number] Date of Birth/Sex: December 05, 1950 (64 y.o. Female) Treating RN: Phillis Haggis Primary Care Physician: Daniel Nones Other Clinician: Referring Physician: RATCLIFFE, HEATHER Treating Physician/Extender: Rudene Re in Treatment: 0 Clinic Level of Care Assessment Items TOOL 2 Quantity Score X - Use when only an EandM is performed on the INITIAL visit 1 0 ASSESSMENTS - Nursing Assessment / Reassessment []  - General Physical Exam (combine w/ comprehensive assessment (listed just 0 below) when performed on new pt. evals) X - Comprehensive Assessment (HX, ROS, Risk Assessments, Wounds Hx, etc.) 1 25 ASSESSMENTS - Wound and Skin Assessment / Reassessment X - Simple Wound Assessment / Reassessment - one wound 1 5 []  - Complex Wound Assessment / Reassessment - multiple wounds 0 []  - Dermatologic / Skin Assessment (not related to wound area) 0 ASSESSMENTS - Ostomy and/or Continence Assessment and Care []  - Incontinence Assessment and Management 0 []  - Ostomy Care Assessment and Management (repouching, etc.) 0 PROCESS - Coordination of Care []  - Simple Patient / Family Education for ongoing care 0 X - Complex (extensive) Patient / Family Education for ongoing care 1 20 []  - Staff obtains Chiropractor, Records, Test Results / Process Orders 0 []  - Staff telephones HHA, Nursing Homes / Clarify orders / etc 0 []  - Routine Transfer to another Facility (non-emergent condition) 0 []  - Routine Hospital Admission (non-emergent condition) 0 []  - New Admissions / Manufacturing engineer / Ordering NPWT, Apligraf, etc. 0 []  - Emergency Hospital Admission (emergent condition) 0 X - Simple Discharge Coordination 1 10 Rolin, KAMYIA THOMASON ([id number]) []  - Complex (extensive) Discharge Coordination 0 PROCESS - Special Needs []  - Pediatric / Minor Patient Management 0 []  - Isolation Patient Management 0 []  - Hearing / Language / Visual special needs 0 []  - Assessment of Community assistance  (transportation, D/C planning, etc.) 0 []  - Additional assistance / Altered mentation 0 []  - Support Surface(s) Assessment (bed, cushion, seat, etc.) 0 INTERVENTIONS - Wound Cleansing / Measurement X - Wound Imaging (photographs - any number of wounds) 1 5 []  - Wound Tracing (instead of photographs) 0 X - Simple Wound Measurement - one wound 1 5 []  - Complex Wound Measurement - multiple  wounds 0 X - Simple Wound Cleansing - one wound 1 5 []  - Complex Wound Cleansing - multiple wounds 0 INTERVENTIONS - Wound Dressings []  - Small Wound Dressing one or multiple wounds 0 []  - Medium Wound Dressing one or multiple wounds 0 []  - Large Wound Dressing one or multiple wounds 0 []  - Application of Medications - injection 0 INTERVENTIONS - Miscellaneous []  - External ear exam 0 []  - Specimen Collection (cultures, biopsies, blood, body fluids, etc.) 0 []  - Specimen(s) / Culture(s) sent or taken to Lab for analysis 0 []  - Patient Transfer (multiple staff / Michiel SitesHoyer Lift / Similar devices) 0 []  - Simple Staple / Suture removal (25 or less) 0 []  - Complex Staple / Suture removal (26 or more) 0 Fortune, Smitty CordsEILEEN M. ([id number]) []  - Hypo / Hyperglycemic Management (close monitor of Blood Glucose) 0 X - Ankle / Brachial Index (ABI) - do not check if billed separately 1 15 Has the patient been seen at the hospital within the last three years: Yes Total Score: 90 Level Of Care: New/Established - Level 3 Electronic Signature(s) Signed: 04/05/2015 3:59:50 PM By: Alejandro MullingPinkerton, Debra Entered By: Alejandro MullingPinkerton, Debra on 04/01/2015 12:02:22 Desiree BridgeMCKENNA, Elois M. ([id number]) -------------------------------------------------------------------------------- Encounter Discharge Information Details Patient Name: Desiree BridgeMCKENNA, [name] Date of Service: 04/01/2015 10:15 AM Medical Record Number: [id number] Patient Account Number: [id number] Date of Birth/Sex: Feb 05, 1951 (64 y.o. Female) Treating RN: Phillis HaggisPinkerton, Debi Primary Care  Physician: Daniel NonesKLEIN, BERT Other Clinician: Referring Physician: RATCLIFFE, HEATHER Treating Physician/Extender: Rudene ReBritto, Errol Weeks in Treatment: 0 Encounter Discharge Information Items Discharge Pain Level: 0 Discharge Condition: Stable Ambulatory Status: Ambulatory Discharge Destination: Home Transportation: Private Auto Accompanied By: husband Schedule Follow-up Appointment: Yes Medication Reconciliation completed and provided to Patient/Care Yes Bryahna Lesko: Provided on Clinical Summary of Care: 04/01/2015 Form Type Recipient Paper Patient EM Electronic Signature(s) Signed: 04/05/2015 3:59:50 PM By: Alejandro MullingPinkerton, Debra Previous Signature: 04/01/2015 11:57:26 AM Version By: Gwenlyn PerkingMoore, Shelia Entered By: Alejandro MullingPinkerton, Debra on 04/01/2015 12:03:15 Desiree BridgeMCKENNA, Shelbia M. ([id number]) -------------------------------------------------------------------------------- Lower Extremity Assessment Details Patient Name: Desiree BridgeMCKENNA, Kona M. Date of Service: 04/01/2015 10:15 AM Medical Record Number: [id number] Patient Account Number: [id number] Date of Birth/Sex: Feb 05, 1951 (64 y.o. Female) Treating RN: Ashok CordiaPinkerton, Debi Primary Care Physician: Daniel NonesKLEIN, BERT Other Clinician: Referring Physician: RATCLIFFE, HEATHER Treating Physician/Extender: Rudene ReBritto, Errol Weeks in Treatment: 0 Edema Assessment Assessed: [Left: No] [Right: No] Edema: [Left: N] [Right: o] Calf Left: Right: Point of Measurement: 33 cm From Medial Instep 37.1 cm cm Ankle Left: Right: Point of Measurement: 9 cm From Medial Instep 19.5 cm cm Vascular Assessment Pulses: Posterior Tibial Palpable: [Left:Yes] Doppler: [Left:Monophasic] Dorsalis Pedis Palpable: [Left:Yes] Doppler: [Left:Monophasic] Extremity colors, hair growth, and conditions: Extremity Color: [Left:Normal] Hair Growth on Extremity: [Left:Yes] Temperature of Extremity: [Left:Warm] Capillary Refill: [Left:< 3 seconds] Blood Pressure: Brachial: [Left:140] Dorsalis Pedis:  142 [Left:Dorsalis Pedis:] Ankle: Posterior Tibial: 138 [Left:Posterior Tibial: 1.01] Toe Nail Assessment Left: Right: Thick: No Discolored: No Deformed: No Improper Length and Hygiene: No Desiree BridgeMCKENNA, Verdis M. ([id number]) Electronic Signature(s) Signed: 04/05/2015 3:59:50 PM By: Alejandro MullingPinkerton, Debra Entered By: Alejandro MullingPinkerton, Debra on 04/01/2015 11:18:30 Desiree BridgeMCKENNA, Tahiry M. ([id number]) -------------------------------------------------------------------------------- Multi Wound Chart Details Patient Name: Desiree BridgeMCKENNA, Oneika M. Date of Service: 04/01/2015 10:15 AM Medical Record Number: [id number] Patient Account Number: [id number] Date of Birth/Sex: Feb 05, 1951 (64 y.o. Female) Treating RN: Phillis HaggisPinkerton, Debi Primary Care Physician: Daniel NonesKLEIN, BERT Other Clinician: Referring Physician: RATCLIFFE, HEATHER Treating Physician/Extender: Rudene ReBritto, Errol Weeks in Treatment: 0 Vital Signs Height(in): 65 Pulse(bpm): 75 Weight(lbs): 180 Blood Pressure 109/87 (mmHg): Body Mass Index(BMI): 30 Temperature(F):  97.8 Respiratory Rate 20 (breaths/min): Photos: [1:No Photos] [N/A:N/A] Wound Location: [1:Left Lower Leg - Anterior] [N/A:N/A] Wounding Event: [1:Trauma] [N/A:N/A] Primary Etiology: [1:To be determined] [N/A:N/A] Date Acquired: [1:10/06/2014] [N/A:N/A] Weeks of Treatment: [1:0] [N/A:N/A] Wound Status: [1:Open] [N/A:N/A] Measurements L x W x D 4x2.5x0.1 [N/A:N/A] (cm) Area (cm) : [1:7.854] [N/A:N/A] Volume (cm) : [1:0.785] [N/A:N/A] Classification: [1:Partial Thickness] [N/A:N/A] Exudate Amount: [1:None Present] [N/A:N/A] Wound Margin: [1:Flat and Intact] [N/A:N/A] Granulation Amount: [1:Large (67-100%)] [N/A:N/A] Granulation Quality: [1:Red] [N/A:N/A] Necrotic Amount: [1:Small (1-33%)] [N/A:N/A] Exposed Structures: [1:Fascia: No Fat: No Tendon: No Muscle: No Joint: No Bone: No Limited to Skin Breakdown] [N/A:N/A] Epithelialization: [1:None] [N/A:N/A] Periwound Skin Texture: Edema: Yes  [N/A:N/A] Periwound Skin [1:Dry/Scaly: Yes] [N/A:N/A] Moisture: Periwound Skin Color: No Abnormalities Noted [N/A:N/A] Temperature: No Abnormality N/A N/A Tenderness on Yes N/A N/A Palpation: Wound Preparation: Ulcer Cleansing: N/A N/A Rinsed/Irrigated with Saline Topical Anesthetic Applied: Other: lidocaine 4% Treatment Notes Electronic Signature(s) Signed: 04/05/2015 3:59:50 PM By: Alejandro Mulling Entered By: Alejandro Mulling on 04/01/2015 11:48:17 Desiree Ramirez ([id number]) -------------------------------------------------------------------------------- Multi-Disciplinary Care Plan Details Patient Name: Desiree Ramirez. Date of Service: 04/01/2015 10:15 AM Medical Record Number: [id number] Patient Account Number: [id number] Date of Birth/Sex: [birth year] (64 y.o. Female) Treating RN: Phillis Haggis Primary Care Physician: Daniel Nones Other Clinician: Referring Physician: RATCLIFFE, HEATHER Treating Physician/Extender: Rudene Re in Treatment: 0 Active Inactive Abuse / Safety / Falls / Self Care Management Nursing Diagnoses: Knowledge deficit related to: safety; personal, health (wound), emergency Goals: Patient will remain injury free Date Initiated: 04/01/2015 Goal Status: Active Interventions: Assess impairment of mobility on admission and as needed per policy Notes: Orientation to the Wound Care Program Nursing Diagnoses: Knowledge deficit related to the wound healing center program Goals: Patient/caregiver will verbalize understanding of the Wound Healing Center Program Date Initiated: 04/01/2015 Goal Status: Active Interventions: Provide education on orientation to the wound center Notes: Wound/Skin Impairment Nursing Diagnoses: Impaired tissue integrity Goals: Patient/caregiver will verbalize understanding of skin care regimen Date Initiated: 04/01/2015 Desiree Ramirez ([id number]) Goal Status: Active Ulcer/skin breakdown will have a  volume reduction of 30% by week 4 Date Initiated: 04/01/2015 Goal Status: Active Ulcer/skin breakdown will have a volume reduction of 50% by week 8 Date Initiated: 04/01/2015 Goal Status: Active Ulcer/skin breakdown will have a volume reduction of 80% by week 12 Date Initiated: 04/01/2015 Goal Status: Active Interventions: Assess patient/caregiver ability to obtain necessary supplies Assess patient/caregiver ability to perform ulcer/skin care regimen upon admission and as needed Provide education on ulcer and skin care Notes: Electronic Signature(s) Signed: 04/05/2015 3:59:50 PM By: Alejandro Mulling Entered By: Alejandro Mulling on 04/01/2015 11:48:09 Desiree Ramirez ([id number]) -------------------------------------------------------------------------------- Pain Assessment Details Patient Name: Desiree Ramirez. Date of Service: 04/01/2015 10:15 AM Medical Record Number: [id number] Patient Account Number: [id number] Date of Birth/Sex: 1951-01-20 (64 y.o. Female) Treating RN: Phillis Haggis Primary Care Physician: Daniel Nones Other Clinician: Referring Physician: RATCLIFFE, HEATHER Treating Physician/Extender: Rudene Re in Treatment: 0 Active Problems Location of Pain Severity and Description of Pain Patient Has Paino No Site Locations Pain Management and Medication Current Pain Management: Electronic Signature(s) Signed: 04/05/2015 3:59:50 PM By: Alejandro Mulling Entered By: Alejandro Mulling on 04/01/2015 11:06:57 Desiree Ramirez ([id number]) -------------------------------------------------------------------------------- Patient/Caregiver Education Details Patient Name: Desiree Ramirez. Date of Service: 04/01/2015 10:15 AM Medical Record Number: [id number] Patient Account Number: [id number] Date of Birth/Gender: [birth year] (64 y.o. Female) Treating RN: Phillis Haggis Primary Care Physician: Daniel Nones Other Clinician: Referring Physician: RATCLIFFE,  HEATHER Treating Physician/Extender: Rudene Re  in Treatment: 0 Education Assessment Education Provided To: Patient and Caregiver Education Topics Provided Wound/Skin Impairment: Handouts: Other: use medication as prescribed Methods: Demonstration, Explain/Verbal Responses: State content correctly Electronic Signature(s) Signed: 04/05/2015 3:59:50 PM By: Alejandro Mulling Entered By: Alejandro Mulling on 04/01/2015 12:03:42 Desiree Ramirez ([id number]) -------------------------------------------------------------------------------- Wound Assessment Details Patient Name: Desiree Ramirez. Date of Service: 04/01/2015 10:15 AM Medical Record Number: [id number] Patient Account Number: [id number] Date of Birth/Sex: January 16, 1951 (64 y.o. Female) Treating RN: Phillis Haggis Primary Care Physician: Daniel Nones Other Clinician: Referring Physician: RATCLIFFE, HEATHER Treating Physician/Extender: Rudene Re in Treatment: 0 Wound Status Wound Number: 1 Primary Etiology: To be determined Wound Location: Left Lower Leg - Anterior Wound Status: Open Wounding Event: Trauma Date Acquired: 10/06/2014 Weeks Of Treatment: 0 Clustered Wound: No Photos Photo Uploaded By: Alejandro Mulling on 04/01/2015 16:39:24 Wound Measurements Length: (cm) 4 % Reduction in Width: (cm) 2.5 % Reduction in Depth: (cm) 0.1 Epithelializati Area: (cm) 7.854 Tunneling: Volume: (cm) 0.785 Undermining: Area: Volume: on: None No No Wound Description Classification: Partial Thickness Wound Margin: Flat and Intact Exudate Amount: None Present Foul Odor After Cleansing: No Wound Bed Granulation Amount: Large (67-100%) Exposed Structure Granulation Quality: Red Fascia Exposed: No Necrotic Amount: Small (1-33%) Fat Layer Exposed: No Necrotic Quality: Adherent Slough Tendon Exposed: No Muscle Exposed: No Joint Exposed: No Borrero, Smitty Cords ([id number]) Bone Exposed: No Limited to  Skin Breakdown Periwound Skin Texture Texture Color No Abnormalities Noted: No No Abnormalities Noted: No Localized Edema: Yes Temperature / Pain Moisture Temperature: No Abnormality No Abnormalities Noted: No Tenderness on Palpation: Yes Dry / Scaly: Yes Wound Preparation Ulcer Cleansing: Rinsed/Irrigated with Saline Topical Anesthetic Applied: Other: lidocaine 4%, Treatment Notes Wound #1 (Left, Anterior Lower Leg) 1. Cleansed with: Clean wound with Normal Saline 2. Anesthetic Topical Lidocaine 4% cream to wound bed prior to debridement Electronic Signature(s) Signed: 04/05/2015 3:59:50 PM By: Alejandro Mulling Entered By: Alejandro Mulling on 04/01/2015 11:22:15 Desiree Ramirez ([id number]) -------------------------------------------------------------------------------- Vitals Details Patient Name: Desiree Ramirez. Date of Service: 04/01/2015 10:15 AM Medical Record Number: [id number] Patient Account Number: [id number] Date of Birth/Sex: December 26, 1950 (64 y.o. Female) Treating RN: Ashok Cordia, Debi Primary Care Physician: Daniel Nones Other Clinician: Referring Physician: RATCLIFFE, HEATHER Treating Physician/Extender: Rudene Re in Treatment: 0 Vital Signs Time Taken: 11:07 Temperature (F): 97.8 Height (in): 65 Pulse (bpm): 75 Source: Stated Respiratory Rate (breaths/min): 20 Weight (lbs): 180 Blood Pressure (mmHg): 109/87 Source: Stated Reference Range: 80 - 120 mg / dl Body Mass Index (BMI): 30 Electronic Signature(s) Signed: 04/05/2015 3:59:50 PM By: Alejandro Mulling Entered By: Alejandro Mulling on 04/01/2015 11:08:53

## 2015-04-06 NOTE — Progress Notes (Signed)
Desiree Ramirez, Desiree Ramirez ([id number]) Visit Report for 04/01/2015 Abuse/Suicide Risk Screen Details Patient Name: Desiree Ramirez, Desiree Ramirez 04/01/2015 10:15 Date of Service: AM Medical Record [id number] Number: Patient Account Number: [id number] 1950/12/01 (64 y.o. Treating RN: Phillis Haggis Date of Birth/Sex: Female) Other Clinician: Primary Care Physician: Daniel Nones Treating Britto, Errol Referring Physician: RATCLIFFE, HEATHER Physician/Extender: Weeks in Treatment: 0 Abuse/Suicide Risk Screen Items Answer ABUSE/SUICIDE RISK SCREEN: Has anyone close to you tried to hurt or harm you recentlyo No Do you feel uncomfortable with anyone in your familyo No Has anyone forced you do things that you didnot want to doo No Do you have any thoughts of harming yourselfo No Patient displays signs or symptoms of abuse and/or neglect. No Electronic Signature(s) Signed: 04/05/2015 3:59:50 PM By: Alejandro Mulling Entered By: Alejandro Mulling on 04/01/2015 11:33:24 Desiree Ramirez ([id number]) -------------------------------------------------------------------------------- Activities of Daily Living Details Patient Name: Desiree Ramirez, Desiree Ramirez 04/01/2015 10:15 Date of Service: AM Medical Record [id number] Number: Patient Account Number: [id number] 1951/01/31 (64 y.o. Treating RN: Phillis Haggis Date of Birth/Sex: Female) Other Clinician: Primary Care Physician: Daniel Nones Treating Evlyn Kanner Referring Physician: RATCLIFFE, HEATHER Physician/Extender: Tania Ade in Treatment: 0 Activities of Daily Living Items Answer Activities of Daily Living (Please select one for each item) Drive Automobile Completely Able Take Medications Completely Able Use Telephone Completely Able Care for Appearance Completely Able Use Toilet Completely Able Bath / Shower Completely Able Dress Self Completely Able Feed Self Completely Able Walk Completely Able Get In / Out Bed Completely Able Housework Completely  Able Prepare Meals Completely Able Handle Money Completely Able Shop for Self Completely Able Electronic Signature(s) Signed: 04/05/2015 3:59:50 PM By: Alejandro Mulling Entered By: Alejandro Mulling on 04/01/2015 11:33:48 Desiree Ramirez ([id number]) -------------------------------------------------------------------------------- Education Assessment Details Patient Name: Desiree Ramirez. 04/01/2015 10:15 Date of Service: AM Medical Record [id number] Number: Patient Account Number: [id number] Jul 30, 1950 (64 y.o. Treating RN: Phillis Haggis Date of Birth/Sex: Female) Other Clinician: Primary Care Physician: Daniel Nones Treating Britto, Errol Referring Physician: RATCLIFFE, HEATHER Physician/Extender: Tania Ade in Treatment: 0 Learning Preferences/Education Level/Primary Language Learning Preference: Explanation, Printed Material Highest Education Level: High School Preferred Language: English Cognitive Barrier Assessment/Beliefs Language Barrier: No Translator Needed: No Memory Deficit: No Emotional Barrier: No Cultural/Religious Beliefs Affecting Medical No Care: Physical Barrier Assessment Impaired Vision: Yes Glasses Impaired Hearing: No Decreased Hand dexterity: No Knowledge/Comprehension Assessment Knowledge Level: High Comprehension Level: High Ability to understand written High instructions: Ability to understand verbal High instructions: Motivation Assessment Anxiety Level: Calm Cooperation: Cooperative Education Importance: Acknowledges Need Interest in Health Problems: Asks Questions Perception: Coherent Willingness to Engage in Self- High Management Activities: Readiness to Engage in Self- High Management Activities: Desiree Ramirez, Desiree Ramirez ([id number]) Electronic Signature(s) Signed: 04/05/2015 3:59:50 PM By: Alejandro Mulling Entered By: Alejandro Mulling on 04/01/2015 11:34:21 Desiree Ramirez  ([id number]) -------------------------------------------------------------------------------- Fall Risk Assessment Details Patient Name: Desiree Ramirez. 04/01/2015 10:15 Date of Service: AM Medical Record [id number] Number: Patient Account Number: [id number] [birth year] (64 y.o. Treating RN: Phillis Haggis Date of Birth/Sex: Female) Other Clinician: Primary Care Physician: Daniel Nones Treating Britto, Errol Referring Physician: RATCLIFFE, HEATHER Physician/Extender: Tania Ade in Treatment: 0 Fall Risk Assessment Items Have you had 2 or more falls in the last 12 monthso 0 Yes Have you had any fall that resulted in injury in the last 12 monthso 0 No FALL RISK ASSESSMENT: History of falling - immediate or within 3 months 0 No Secondary diagnosis 0 No Ambulatory aid None/bed rest/wheelchair/nurse 0 No Crutches/cane/walker 0 No Furniture 0 No IV  Access/Saline Lock 0 No Gait/Training Normal/bed rest/immobile 0 No Weak 0 No Impaired 0 No Mental Status Oriented to own ability 0 Yes Electronic Signature(s) Signed: 04/05/2015 3:59:50 PM By: Alejandro MullingPinkerton, Debra Entered By: Alejandro MullingPinkerton, Debra on 04/01/2015 11:35:22 Desiree BridgeMCKENNA, Desiree M. ([id number]) -------------------------------------------------------------------------------- Foot Assessment Details Patient Name: Desiree BridgeMCKENNA, Desiree M. 04/01/2015 10:15 Date of Service: AM Medical Record [id number] Number: Patient Account Number: [id number] 04/02/1951 (64 y.o. Treating RN: Phillis HaggisPinkerton, Debi Date of Birth/Sex: Female) Other Clinician: Primary Care Physician: Daniel NonesKLEIN, BERT Treating Britto, Errol Referring Physician: RATCLIFFE, HEATHER Physician/Extender: Tania AdeWeeks in Treatment: 0 Foot Assessment Items Site Locations + = Sensation present, - = Sensation absent, C = Callus, U = Ulcer R = Redness, W = Warmth, M = Maceration, PU = Pre-ulcerative lesion F = Fissure, S = Swelling, D = Dryness Assessment Right: Left: Other Deformity: No No Prior Foot  Ulcer: No No Prior Amputation: No No Charcot Joint: No No Ambulatory Status: Ambulatory Without Help Gait: Steady Electronic Signature(s) Signed: 04/05/2015 3:59:50 PM By: Alejandro MullingPinkerton, Debra Entered By: Alejandro MullingPinkerton, Debra on 04/01/2015 11:37:56 Desiree BridgeMCKENNA, Desiree M. ([id number]) -------------------------------------------------------------------------------- Nutrition Risk Assessment Details Patient Name: Desiree BridgeMCKENNA, Desiree M. 04/01/2015 10:15 Date of Service: AM Medical Record [id number] Number: Patient Account Number: [id number] 04/02/1951 (64 y.o. Treating RN: Phillis HaggisPinkerton, Debi Date of Birth/Sex: Female) Other Clinician: Primary Care Physician: Daniel NonesKLEIN, BERT Treating Britto, Errol Referring Physician: RATCLIFFE, HEATHER Physician/Extender: Weeks in Treatment: 0 Height (in): 65 Weight (lbs): 180 Body Mass Index (BMI): 30 Nutrition Risk Assessment Items NUTRITION RISK SCREEN: I have an illness or condition that made me change the kind and/or 0 No amount of food I eat I eat fewer than two meals per day 0 No I eat few fruits and vegetables, or milk products 0 No I have three or more drinks of beer, liquor or wine almost every day 0 No I have tooth or mouth problems that make it hard for me to eat 0 No I don't always have enough money to buy the food I need 0 No I eat alone most of the time 0 No I take three or more different prescribed or over-the-counter drugs a 1 Yes day Without wanting to, I have lost or gained 10 pounds in the last six 0 No months I am not always physically able to shop, cook and/or feed myself 0 No Nutrition Protocols Good Risk Protocol Moderate Risk Protocol Electronic Signature(s) Signed: 04/05/2015 3:59:50 PM By: Alejandro MullingPinkerton, Debra Entered By: Alejandro MullingPinkerton, Debra on 04/01/2015 11:37:24

## 2015-04-08 ENCOUNTER — Encounter: Payer: BLUE CROSS/BLUE SHIELD | Admitting: Surgery

## 2015-04-08 DIAGNOSIS — E11622 Type 2 diabetes mellitus with other skin ulcer: Secondary | ICD-10-CM | POA: Diagnosis not present

## 2015-04-09 NOTE — Progress Notes (Signed)
Desiree Ramirez, Desiree Ramirez ([id number]) Visit Report for 04/08/2015 Chief Complaint Document Details Patient Name: Desiree Ramirez, Desiree Ramirez 04/08/2015 3:00 Date of Service: PM Medical Record [id number] Number: Patient Account Number: [id number] 1950/11/19 (64 y.o. Treating RN: Curtis Sites Date of Birth/Sex: Female) Other Clinician: Primary Care Physician: Daniel Nones Treating Evlyn Kanner Referring Physician: Daniel Nones Physician/Extender: Tania Ade in Treatment: 1 Information Obtained from: Patient Chief Complaint Patient presents to the wound care center for a consult due non healing wound to the left lower extremity which she's had for about 6 months now. Electronic Signature(s) Signed: 04/08/2015 3:28:14 PM By: Evlyn Kanner MD, FACS Entered By: Evlyn Kanner on 04/08/2015 15:28:14 Desiree Ramirez ([id number]) -------------------------------------------------------------------------------- HPI Details Patient Name: Desiree Ramirez. 04/08/2015 3:00 Date of Service: PM Medical Record [id number] Number: Patient Account Number: [id number] [birth year] (64 y.o. Treating RN: Curtis Sites Date of Birth/Sex: Female) Other Clinician: Primary Care Physician: Daniel Nones Treating Evlyn Kanner Referring Physician: Daniel Nones Physician/Extender: Weeks in Treatment: 1 History of Present Illness Location: left lower extremity Quality: Patient reports No Pain. Severity: Patient states wound are getting worse. Duration: Patient has had the wound for > 5 months prior to seeking treatment at the wound center Context: The wound appeared gradually over time. she thought initially she had a bump and a slight abrasion in this area. Modifying Factors: Other treatment(s) tried include:was treated with Bactrim and also been treated with Diflucan orally for a fungal infection in a perineural region Associated Signs and Symptoms: the patient says she has a lot of itching and scratching around  her perineal region. HPI Description: 64 year old patient who is known to have diabetes mellitus for several years now comes with a history of having a blunt injury to her left lower extremity in June 2016. The wound never healed and she continues to have local problems. She was seen by her PCP recently and given symptomatic treatment with Bactrim DS and was also being treated for candidiasis her perineal region. Under medical care the wound progressed to have further issues and hence she was referred to [place]. 04/08/2015 -- she has started using the antifungal cream but has not yet got in touch with her PCP at the [place] [facility] to get a referral to the dermatologist. Electronic Signature(s) Signed: 04/08/2015 3:29:03 PM By: Evlyn Kanner MD, FACS Entered By: Evlyn Kanner on 04/08/2015 15:29:03 Desiree Ramirez ([id number]) -------------------------------------------------------------------------------- Physical Exam Details Patient Name: Desiree Ramirez. 04/08/2015 3:00 Date of Service: PM Medical Record [id number] Number: Patient Account Number: [id number] 07/14/1950 (64 y.o. Treating RN: Curtis Sites Date of Birth/Sex: Female) Other Clinician: Primary Care Physician: Daniel Nones Treating Evlyn Kanner Referring Physician: Daniel Nones Physician/Extender: Weeks in Treatment: 1 Constitutional . Pulse regular. Respirations normal and unlabored. Afebrile. . Eyes Nonicteric. Reactive to light. Ears, Nose, Mouth, and Throat Lips, teeth, and gums WNL.Marland Kitchen Moist mucosa without lesions . Neck supple and nontender. No palpable supraclavicular or cervical adenopathy. Normal sized without goiter. Respiratory WNL. No retractions.. Cardiovascular Pedal Pulses WNL. No clubbing, cyanosis or edema. Lymphatic No adneopathy. No adenopathy. No adenopathy. Musculoskeletal Adexa without tenderness or enlargement.. Digits and nails w/o clubbing, cyanosis, infection, petechiae, ischemia, or  inflammatory conditions.. Integumentary (Hair, Skin) No suspicious lesions. No crepitus or fluctuance. No peri-wound warmth or erythema. No masses.Marland Kitchen Psychiatric Judgement and insight Intact.. No evidence of depression, anxiety, or agitation.. Notes the area on her left shin has a circular lesion typical for tinea corporis and it is a little lighter but it is not completely gone. There  are no open ulcerations and there is no wound. Electronic Signature(s) Signed: 04/08/2015 3:30:26 PM By: Evlyn Kanner MD, FACS Entered By: Evlyn Kanner on 04/08/2015 15:30:26 Desiree Ramirez ([id number]) -------------------------------------------------------------------------------- Physician Orders Details Patient Name: Desiree Ramirez, Desiree Ramirez 04/08/2015 3:00 Date of Service: PM Medical Record [id number] Number: Patient Account Number: [id number] [birth year] (64 y.o. Treating RN: Curtis Sites Date of Birth/Sex: Female) Other Clinician: Primary Care Physician: Daniel Nones Treating Evlyn Kanner Referring Physician: Daniel Nones Physician/Extender: Tania Ade in Treatment: 1 Verbal / Phone Orders: Yes Clinician: Curtis Sites Read Back and Verified: Yes Diagnosis Coding Discharge From [facility] Wound #1 Left,Anterior Lower Leg o Discharge from Wound Care Center - Continue antifungal cream until you are seen by dermatologist - follow up with your primary care doctor for referral to dermatologist Electronic Signature(s) Signed: 04/08/2015 4:28:44 PM By: Evlyn Kanner MD, FACS Signed: 04/08/2015 5:02:54 PM By: Curtis Sites Entered By: Curtis Sites on 04/08/2015 15:25:50 Desiree Ramirez ([id number]) -------------------------------------------------------------------------------- Problem List Details Patient Name: Desiree Ramirez, Desiree Ramirez 04/08/2015 3:00 Date of Service: PM Medical Record [id number] Number: Patient Account Number: [id number] May 07, 1950 (64 y.o. Treating RN: Curtis Sites Date  of Birth/Sex: Female) Other Clinician: Primary Care Physician: Daniel Nones Treating Evlyn Kanner Referring Physician: Daniel Nones Physician/Extender: Tania Ade in Treatment: 1 Active Problems ICD-10 Encounter Code Description Active Date Diagnosis E11.622 Type 2 diabetes mellitus with other skin ulcer 04/01/2015 Yes B35.4 Tinea corporis 04/01/2015 Yes Inactive Problems Resolved Problems Electronic Signature(s) Signed: 04/08/2015 3:28:08 PM By: Evlyn Kanner MD, FACS Entered By: Evlyn Kanner on 04/08/2015 15:28:08 Desiree Ramirez ([id number]) -------------------------------------------------------------------------------- Progress Note Details Patient Name: Desiree Ramirez. 04/08/2015 3:00 Date of Service: PM Medical Record [id number] Number: Patient Account Number: [id number] 08/02/1950 (64 y.o. Treating RN: Curtis Sites Date of Birth/Sex: Female) Other Clinician: Primary Care Physician: Daniel Nones Treating Evlyn Kanner Referring Physician: Daniel Nones Physician/Extender: Tania Ade in Treatment: 1 Subjective Chief Complaint Information obtained from Patient Patient presents to the wound care center for a consult due non healing wound to the left lower extremity which she's had for about 6 months now. History of Present Illness (HPI) The following HPI elements were documented for the patient's wound: Location: left lower extremity Quality: Patient reports No Pain. Severity: Patient states wound are getting worse. Duration: Patient has had the wound for > 5 months prior to seeking treatment at the wound center Context: The wound appeared gradually over time. she thought initially she had a bump and a slight abrasion in this area. Modifying Factors: Other treatment(s) tried include:was treated with Bactrim and also been treated with Diflucan orally for a fungal infection in a perineural region Associated Signs and Symptoms: the patient says she has a lot of itching and  scratching around her perineal region. 64 year old patient who is known to have diabetes mellitus for several years now comes with a history of having a blunt injury to her left lower extremity in June 2016. The wound never healed and she continues to have local problems. She was seen by her PCP recently and given symptomatic treatment with Bactrim DS and was also being treated for candidiasis her perineal region. Under medical care the wound progressed to have further issues and hence she was referred to [place]. 04/08/2015 -- she has started using the antifungal cream but has not yet got in touch with her PCP at the [place] [facility] to get a referral to the dermatologist. Objective Constitutional Pulse regular. Respirations normal and unlabored. Afebrile. Desiree Ramirez, Desiree Ramirez ([id number]) Vitals Time Taken: 3:11  PM, Height: 65 in, Weight: 180 lbs, BMI: 30, Temperature: 97.9 F, Pulse: 77 bpm, Respiratory Rate: 18 breaths/min, Blood Pressure: 151/91 mmHg. Eyes Nonicteric. Reactive to light. Ears, Nose, Mouth, and Throat Lips, teeth, and gums WNL.Marland Kitchen. Moist mucosa without lesions . Neck supple and nontender. No palpable supraclavicular or cervical adenopathy. Normal sized without goiter. Respiratory WNL. No retractions.. Cardiovascular Pedal Pulses WNL. No clubbing, cyanosis or edema. Lymphatic No adneopathy. No adenopathy. No adenopathy. Musculoskeletal Adexa without tenderness or enlargement.. Digits and nails w/o clubbing, cyanosis, infection, petechiae, ischemia, or inflammatory conditions.Marland Kitchen. Psychiatric Judgement and insight Intact.. No evidence of depression, anxiety, or agitation.. General Notes: the area on her left shin has a circular lesion typical for tinea corporis and it is a little lighter but it is not completely gone. There are no open ulcerations and there is no wound. Integumentary (Hair, Skin) No suspicious lesions. No crepitus or fluctuance. No peri-wound warmth or  erythema. No masses.. Wound #1 status is Open. Original cause of wound was Trauma. The wound is located on the Left,Anterior Lower Leg. The wound measures 3.6cm length x 2.2cm width x 0.1cm depth; 6.22cm^2 area and 0.622cm^3 volume. The wound is limited to skin breakdown. There is no tunneling or undermining noted. There is a none present amount of drainage noted. The wound margin is flat and intact. There is large (67- 100%) red granulation within the wound bed. There is a small (1-33%) amount of necrotic tissue within the wound bed including Adherent Slough. The periwound skin appearance exhibited: Localized Edema, Dry/Scaly. Periwound temperature was noted as No Abnormality. The periwound has tenderness on palpation. Assessment Desiree Ramirez, Desiree M. ([id number]) Active Problems ICD-10 E11.622 - Type 2 diabetes mellitus with other skin ulcer B35.4 - Tinea corporis She has not yet seen her PCP or her dermatologist and I have strongly recommended she do so as soon as possible. Until then she can continue to use the Lamisil AT cream locally once a day. The treatment of her skin condition is beyond the scope of the wound clinic and we have discharged her and asked her to follow-up with the appropriate specialist. [facility] Wound #1 Left,Anterior Lower Leg: Discharge from Wound Care Center - Continue antifungal cream until you are seen by dermatologist - follow up with your primary care doctor for referral to dermatologist She has not yet seen her PCP or her dermatologist and I have strongly recommended she do so as soon as possible. Until then she can continue to use the Lamisil AT cream locally once a day. The treatment of her skin condition is beyond the scope of the wound clinic and we have discharged her and asked her to follow-up with the appropriate specialist. Electronic Signature(s) Signed: 04/08/2015 3:31:35 PM By: Evlyn KannerBritto, Aisea Bouldin MD, FACS Entered By: Evlyn KannerBritto,  Elide Stalzer on 04/08/2015 15:31:35 Desiree Ramirez, Desiree M. ([id number]) -------------------------------------------------------------------------------- SuperBill Details Patient Name: Desiree Ramirez, Desiree M. Date of Service: 04/08/2015 Medical Record Number: [id number] Patient Account Number: [id number] Date of Birth/Sex: 1950-12-28 8(64 y.o. Female) Treating RN: Curtis Sitesorthy, Joanna Primary Care Physician: Daniel NonesKLEIN, BERT Other Clinician: Referring Physician: Daniel NonesKLEIN, BERT Treating Physician/Extender: Rudene ReBritto, Leata Dominy Weeks in Treatment: 1 Diagnosis Coding ICD-10 Codes Code Description E11.622 Type 2 diabetes mellitus with other skin ulcer B35.4 Tinea corporis Facility Procedures CPT4 Code: [id number] Description: 99213 - WOUND CARE VISIT-LEV 3 EST PT Modifier: Quantity: 1 Physician Procedures CPT4 Code: [id number] Description: [id number] - WC PHYS LEVEL 2 - EST PT ICD-10 Description Diagnosis E11.622 Type 2 diabetes mellitus with other skin  ulcer B35.4 Tinea corporis Modifier: Quantity: 1 Electronic Signature(s) Signed: 04/08/2015 3:31:50 PM By: Evlyn Kanner MD, FACS Entered By: Evlyn Kanner on 04/08/2015 15:31:50

## 2015-04-09 NOTE — Progress Notes (Signed)
TENIKA, KEERAN ([id number]) Visit Report for 04/08/2015 Arrival Information Details Patient Name: Desiree Ramirez, Desiree Ramirez 04/08/2015 3:00 Date of Service: PM Medical Record [id number] Number: Patient Account Number: [id number] 1950-09-30 (64 y.o. Treating RN: Curtis Sites Date of Birth/Sex: Female) Other Clinician: Primary Care Physician: Daniel Nones Treating Britto, Errol Referring Physician: Daniel Nones Physician/Extender: Tania Ade in Treatment: 1 Visit Information History Since Last Visit Added or deleted any medications: No Patient Arrived: Ambulatory Any new allergies or adverse reactions: No Arrival Time: 15:10 Had a fall or experienced change in No Accompanied By: self activities of daily living that may affect Transfer Assistance: None risk of falls: Patient Identification Verified: Yes Signs or symptoms of abuse/neglect since last No Secondary Verification Process Yes visito Completed: Hospitalized since last visit: No Patient Requires Transmission-Based No Pain Present Now: No Precautions: Patient Has Alerts: Yes Patient Alerts: DM II Aspirin Electronic Signature(s) Signed: 04/08/2015 5:02:54 PM By: Curtis Sites Entered By: Curtis Sites on 04/08/2015 15:10:58 Desiree Ramirez ([id number]) -------------------------------------------------------------------------------- Clinic Level of Care Assessment Details Patient Name: Desiree Ramirez. 04/08/2015 3:00 Date of Service: PM Medical Record [id number] Number: Patient Account Number: [id number] [birth year] (64 y.o. Treating RN: Curtis Sites Date of Birth/Sex: Female) Other Clinician: Primary Care Physician: Daniel Nones Treating Britto, Errol Referring Physician: Daniel Nones Physician/Extender: Tania Ade in Treatment: 1 Clinic Level of Care Assessment Items TOOL 4 Quantity Score  - Use when only an EandM is performed on FOLLOW-UP visit 0 ASSESSMENTS - Nursing Assessment / Reassessment X -  Reassessment of Co-morbidities (includes updates in patient status) 1 10 X - Reassessment of Adherence to Treatment Plan 1 5 ASSESSMENTS - Wound and Skin Assessment / Reassessment X - Simple Wound Assessment / Reassessment - one wound 1 5  - Complex Wound Assessment / Reassessment - multiple wounds 0  - Dermatologic / Skin Assessment (not related to wound area) 0 ASSESSMENTS - Focused Assessment  - Circumferential Edema Measurements - multi extremities 0  - Nutritional Assessment / Counseling / Intervention 0 X - Lower Extremity Assessment (monofilament, tuning fork, pulses) 1 5  - Peripheral Arterial Disease Assessment (using hand held doppler) 0 ASSESSMENTS - Ostomy and/or Continence Assessment and Care  - Incontinence Assessment and Management 0  - Ostomy Care Assessment and Management (repouching, etc.) 0 PROCESS - Coordination of Care X - Simple Patient / Family Education for ongoing care 1 15  - Complex (extensive) Patient / Family Education for ongoing care 0  - Staff obtains Chiropractor, Records, Test Results / Process Orders 0  - Staff telephones HHA, Nursing Homes / Clarify orders / etc 0 Desiree Ramirez, Desiree Ramirez ([id number])  - Routine Transfer to another Facility (non-emergent condition) 0  - Routine Hospital Admission (non-emergent condition) 0  - New Admissions / Manufacturing engineer / Ordering NPWT, Apligraf, etc. 0  - Emergency Hospital Admission (emergent condition) 0 X - Simple Discharge Coordination 1 10  - Complex (extensive) Discharge Coordination 0 PROCESS - Special Needs  - Pediatric / Minor Patient Management 0  - Isolation Patient Management 0  - Hearing / Language / Visual special needs 0  - Assessment of Community assistance (transportation, D/C planning, etc.) 0  - Additional assistance / Altered mentation 0  - Support Surface(s) Assessment (bed, cushion, seat, etc.) 0 INTERVENTIONS - Wound Cleansing / Measurement X -  Simple Wound Cleansing - one wound 1 5  - Complex Wound Cleansing - multiple wounds 0 X - Wound Imaging (photographs - any number of wounds) 1 5  - Wound Tracing (  instead of photographs) 0 X - Simple Wound Measurement - one wound 1 5 []  - Complex Wound Measurement - multiple wounds 0 INTERVENTIONS - Wound Dressings X - Small Wound Dressing one or multiple wounds 1 10 []  - Medium Wound Dressing one or multiple wounds 0 []  - Large Wound Dressing one or multiple wounds 0 []  - Application of Medications - topical 0 []  - Application of Medications - injection 0 Desiree Ramirez, Desiree CordsEILEEN M. ([id number]) INTERVENTIONS - Miscellaneous []  - External ear exam 0 []  - Specimen Collection (cultures, biopsies, blood, body fluids, etc.) 0 []  - Specimen(s) / Culture(s) sent or taken to Lab for analysis 0 []  - Patient Transfer (multiple staff / Michiel SitesHoyer Lift / Similar devices) 0 []  - Simple Staple / Suture removal (25 or less) 0 []  - Complex Staple / Suture removal (26 or more) 0 []  - Hypo / Hyperglycemic Management (close monitor of Blood Glucose) 0 []  - Ankle / Brachial Index (ABI) - do not check if billed separately 0 X - Vital Signs 1 5 Has the patient been seen at the hospital within the last three years: Yes Total Score: 80 Level Of Care: New/Established - Level 3 Electronic Signature(s) Signed: 04/08/2015 5:02:54 PM By: Curtis Sitesorthy, Joanna Entered By: Curtis Sitesorthy, Joanna on 04/08/2015 15:26:20 Desiree BridgeMCKENNA, Desiree M. ([id number]) -------------------------------------------------------------------------------- Encounter Discharge Information Details Patient Name: Desiree BridgeMCKENNA, Desiree M. 04/08/2015 3:00 Date of Service: PM Medical Record [id number] Number: Patient Account Number: [id number] 1950-08-24 (64 y.o. Treating RN: Curtis Sitesorthy, Joanna Date of Birth/Sex: Female) Other Clinician: Primary Care Physician: Daniel NonesKLEIN, BERT Treating Evlyn KannerBritto, Errol Referring Physician: Daniel NonesKLEIN, BERT Physician/Extender: Tania AdeWeeks in Treatment:  1 Encounter Discharge Information Items Facility Notification Discharge Pain Level: 0 Telephoned: Yes Discharge Condition: Stable Orders Sent: Yes Ambulatory Status: Ambulatory Additional Notification Discharge Destination: Home Telephoned: Yes Transportation: Private Auto Orders Sent: Yes Accompanied By: self Schedule Follow-up Appointment: No Medication Reconciliation completed and provided to No Patient/Care Fermin Yan: Provided on Clinical Summary of Care: 04/08/2015 Form Type Recipient Paper Patient EM Electronic Signature(s) Signed: 04/08/2015 3:31:15 PM By: Gwenlyn PerkingMoore, Shelia Entered By: Gwenlyn PerkingMoore, Shelia on 04/08/2015 15:31:15 Desiree BridgeMCKENNA, Mariyam M. ([id number]) -------------------------------------------------------------------------------- Lower Extremity Assessment Details Patient Name: Desiree BridgeMCKENNA, Desiree M. 04/08/2015 3:00 Date of Service: PM Medical Record [id number] Number: Patient Account Number: [id number] 1950-08-24 (64 y.o. Treating RN: Curtis Sitesorthy, Joanna Date of Birth/Sex: Female) Other Clinician: Primary Care Physician: Daniel NonesKLEIN, BERT Treating Evlyn KannerBritto, Errol Referring Physician: Daniel NonesKLEIN, BERT Physician/Extender: Weeks in Treatment: 1 Vascular Assessment Pulses: Posterior Tibial Dorsalis Pedis Palpable: [Left:Yes] Extremity colors, hair growth, and conditions: Extremity Color: [Left:Normal] Hair Growth on Extremity: [Left:Yes] Temperature of Extremity: [Left:Warm] Capillary Refill: [Left:< 3 seconds] Electronic Signature(s) Signed: 04/08/2015 5:02:54 PM By: Curtis Sitesorthy, Joanna Entered By: Curtis Sitesorthy, Joanna on 04/08/2015 15:14:43 Desiree BridgeMCKENNA, Desiree M. ([id number]) -------------------------------------------------------------------------------- Multi-Disciplinary Care Plan Details Patient Name: Desiree BridgeMCKENNA, Desiree M. 04/08/2015 3:00 Date of Service: PM Medical Record [id number] Number: Patient Account Number: [id number] 1950-08-24 (64 y.o. Treating RN: Curtis Sitesorthy, Joanna Date of  Birth/Sex: Female) Other Clinician: Primary Care Physician: Daniel NonesKLEIN, BERT Treating Evlyn KannerBritto, Errol Referring Physician: Daniel NonesKLEIN, BERT Physician/Extender: Tania AdeWeeks in Treatment: 1 Active Inactive Electronic Signature(s) Signed: 04/08/2015 5:02:54 PM By: Curtis Sitesorthy, Joanna Entered By: Curtis Sitesorthy, Joanna on 04/08/2015 15:24:17 Desiree BridgeMCKENNA, Desiree M. ([id number]) -------------------------------------------------------------------------------- Patient/Caregiver Education Details Patient Name: Desiree BridgeMCKENNA, Bettey M. 04/08/2015 3:00 Date of Service: PM Medical Record [id number] Number: Patient Account Number: [id number] 1950-08-24 (64 y.o. Treating RN: Curtis Sitesorthy, Joanna Date of Birth/Gender: Female) Other Clinician: Primary Care Physician: Daniel NonesKLEIN, BERT Treating Evlyn KannerBritto, Errol Referring Physician: Daniel NonesKLEIN, BERT Physician/Extender: Tania AdeWeeks in Treatment: 1 Education Assessment Education  Provided To: Patient Education Topics Provided Wound/Skin Impairment: Handouts: Other: see your PCP for dermatology referral Methods: Explain/Verbal Responses: State content correctly Electronic Signature(s) Signed: 04/08/2015 4:55:30 PM By: Curtis Sites Entered By: Curtis Sites on 04/08/2015 16:55:30 Desiree Ramirez ([id number]) -------------------------------------------------------------------------------- Wound Assessment Details Patient Name: Desiree Ramirez. 04/08/2015 3:00 Date of Service: PM Medical Record [id number] Number: Patient Account Number: [id number] 11-Mar-1951 (64 y.o. Treating RN: Curtis Sites Date of Birth/Sex: Female) Other Clinician: Primary Care Physician: Daniel Nones Treating Britto, Errol Referring Physician: Daniel Nones Physician/Extender: Weeks in Treatment: 1 Wound Status Wound Number: 1 Primary To be determined Etiology: Wound Location: Left, Anterior Lower Leg Wound Open Wounding Event: Trauma Status: Date Acquired: 10/06/2014 Comorbid Cataracts, Asthma, Sleep Apnea, Weeks Of  Treatment: 1 History: Hypertension, Type II Diabetes, Clustered Wound: No Osteoarthritis Photos Photo Uploaded By: Curtis Sites on 04/08/2015 16:51:45 Wound Measurements Length: (cm) 3.6 Width: (cm) 2.2 Depth: (cm) 0.1 Area: (cm) 6.22 Volume: (cm) 0.622 % Reduction in Area: 20.8% % Reduction in Volume: 20.8% Epithelialization: None Tunneling: No Undermining: No Wound Description Classification: Partial Thickness Foul Odor Af Diabetic Severity [address]): Grade 0 Wound Margin: Flat and Intact Exudate Amount: None Present ter Cleansing: No Wound Bed Granulation Amount: Large (67-100%) Exposed Structure Granulation Quality: Red Fascia Exposed: No Necrotic Amount: Small (1-33%) Fat Layer Exposed: No Covey, Desiree Cords ([id number]) Necrotic Quality: Adherent Slough Tendon Exposed: No Muscle Exposed: No Joint Exposed: No Bone Exposed: No Limited to Skin Breakdown Periwound Skin Texture Texture Color No Abnormalities Noted: No No Abnormalities Noted: No Localized Edema: Yes Temperature / Pain Moisture Temperature: No Abnormality No Abnormalities Noted: No Tenderness on Palpation: Yes Dry / Scaly: Yes Wound Preparation Ulcer Cleansing: Rinsed/Irrigated with Saline Topical Anesthetic Applied: None Electronic Signature(s) Signed: 04/08/2015 5:02:54 PM By: Curtis Sites Entered By: Curtis Sites on 04/08/2015 15:22:31 Desiree Ramirez ([id number]) -------------------------------------------------------------------------------- Vitals Details Patient Name: Desiree Ramirez. 04/08/2015 3:00 Date of Service: PM Medical Record [id number] Number: Patient Account Number: [id number] 1951-02-12 (64 y.o. Treating RN: Curtis Sites Date of Birth/Sex: Female) Other Clinician: Primary Care Physician: Daniel Nones Treating Britto, Errol Referring Physician: Daniel Nones Physician/Extender: Weeks in Treatment: 1 Vital Signs Time Taken: 15:11 Temperature (F):  97.9 Height (in): 65 Pulse (bpm): 77 Weight (lbs): 180 Respiratory Rate (breaths/min): 18 Body Mass Index (BMI): 30 Blood Pressure (mmHg): 151/91 Reference Range: 80 - 120 mg / dl Electronic Signature(s) Signed: 04/08/2015 5:02:54 PM By: Curtis Sites Entered By: Curtis Sites on 04/08/2015 15:13:49

## 2015-04-12 ENCOUNTER — Encounter: Payer: Self-pay | Admitting: *Deleted

## 2015-04-12 ENCOUNTER — Encounter: Payer: BLUE CROSS/BLUE SHIELD | Admitting: *Deleted

## 2015-04-12 VITALS — BP 150/78 | Ht 65.0 in | Wt 177.3 lb

## 2015-04-12 DIAGNOSIS — E119 Type 2 diabetes mellitus without complications: Secondary | ICD-10-CM

## 2015-04-12 DIAGNOSIS — E11622 Type 2 diabetes mellitus with other skin ulcer: Secondary | ICD-10-CM | POA: Diagnosis not present

## 2015-04-12 NOTE — Progress Notes (Signed)
Diabetes Self-Management Education  Visit Type: First/Initial  Appt. Start Time: 1035 Appt. End Time: 1200  04/12/2015  [name], identified by name and date of birth, is a 64 y.o. female with a diagnosis of Diabetes: Type 2.   ASSESSMENT  Blood pressure 150/78, height 5\' 5"  (1.651 m), weight 177 lb 4.8 oz (80.423 kg). Body mass index is 29.5 kg/(m^2).      Diabetes Self-Management Education - 04/12/15 1346    Visit Information   Visit Type First/Initial   Initial Visit   Diabetes Type Type 2   Are you currently following a meal plan? No   Are you taking your medications as prescribed? Yes   Date Diagnosed greater than 15 years   Health Coping   How would you rate your overall health? Good   Psychosocial Assessment   Patient Belief/Attitude about Diabetes Other (comment)  "I hate it"   Self-care barriers None   Self-management support Family;Doctor's office   Patient Concerns Problem Solving   Special Needs None   Preferred Learning Style Visual;Hands on   Learning Readiness Not Ready  Pt answered "I don't know" when she was asked about readiness to make lifestyle changes.   How often do you need to have someone help you when you read instructions, pamphlets, or other written materials from your doctor or pharmacy? 1 - Never   What is the last grade level you completed in school? 12th   Complications   Last HgB A1C per patient/outside source 6.4 %  09/03/14   How often do you check your blood sugar? 0 times/day (not testing)  Provided One Touch Ultra Mini meter and instructed on use. BG upon return demonstration was 142 mg/dL at 16:1011:45 am - 3 hrs pp.    Have you had a dilated eye exam in the past 12 months? Yes   Have you had a dental exam in the past 12 months? No   Are you checking your feet? Yes   How many days per week are you checking your feet? 7   Dietary Intake   Breakfast egg salad sandwich; bagel with peanut butter and jelly   Lunch 3:00 am -  protein bar (works 3rd shift)   Dinner spaghetti, potato salad, hot dog, pasta bread, chicken   Beverage(s) regular soda, water, coffee   Exercise   Exercise Type ADL's   Patient Education   Previous Diabetes Education Yes (please comment)  Here at [facility]    Disease state  Factors that contribute to the development of diabetes   Nutrition management  Role of diet in the treatment of diabetes and the relationship between the three main macronutrients and blood glucose level;Food label reading, portion sizes and measuring food.   Physical activity and exercise  Role of exercise on diabetes management, blood pressure control and cardiac health.   Medications Reviewed patients medication for diabetes, action, purpose, timing of dose and side effects.   Monitoring Taught/evaluated SMBG meter.;Purpose and frequency of SMBG.;Identified appropriate SMBG and/or A1C goals.   Chronic complications Relationship between chronic complications and blood glucose control   Psychosocial adjustment Identified and addressed patients feelings and concerns about diabetes   Individualized Goals (developed by patient)   Reducing Risk Prevent diabetes complications   Outcomes   Expected Outcomes Demonstrated interest in learning. Expect positive outcomes      Individualized Plan for Diabetes Self-Management Training:   Learning Objective:  Patient will have a greater understanding of diabetes self-management. Patient education  plan is to attend individual and/or group sessions per assessed needs and concerns.   Plan:   Patient Instructions  Check blood sugars 2 x day before breakfast and 2 hrs after supper 3-4 x week Exercise: Begin walking for 15 minutes  3 days a week and gradually increase to 150 minutes per week Eat 3 meals day,   1-2  snacks a day Space meals 4-6 hours apart Avoid sugar sweetened drinks (soda)  Limit desserts/sweets Complete 3 Day Food Record and bring to next appt Bring  blood sugar records to the next appointment Call your doctor for a prescription for:  1. Meter strips (type) One Touch Ultra Blue    checking  3-4 times per week  2. Lancets (type) One Touch Delica    checking  3-4 times per week  Expected Outcomes:  Demonstrated interest in learning. Expect positive outcomes  Education material provided:  General Meal Planning Guidelines Simple Meal Plan Meter - One Touch Ultra Mini 3 Day Food Record  If problems or questions, patient to contact team via:   Sharion Settler, RN, CCM, CDE [phone]  Future DSME appointment:  Thursday May 05, 2015 at 10:30 am with dietitian

## 2015-04-12 NOTE — Patient Instructions (Addendum)
Check blood sugars 2 x day before breakfast and 2 hrs after supper 3-4 x week Exercise: Begin walking for 15 minutes  3 days a week and gradually increase to 150 minutes per week Eat 3 meals day,   1-2  snacks a day Space meals 4-6 hours apart Avoid sugar sweetened drinks (soda)  Limit desserts/sweets Complete 3 Day Food Record and bring to next appt Bring blood sugar records to the next appointment Call your doctor for a prescription for:  1. Meter strips (type) One Touch Ultra Blue    checking  3-4 times per week  2. Lancets (type) One Touch Delica    checking  3-4 times per week

## 2015-05-05 ENCOUNTER — Ambulatory Visit: Payer: BLUE CROSS/BLUE SHIELD | Admitting: Dietician

## 2015-05-11 ENCOUNTER — Encounter: Payer: BLUE CROSS/BLUE SHIELD | Attending: Surgery | Admitting: Dietician

## 2015-05-11 ENCOUNTER — Encounter: Payer: Self-pay | Admitting: Dietician

## 2015-05-11 VITALS — BP 110/78 | Ht 65.0 in | Wt 180.1 lb

## 2015-05-11 DIAGNOSIS — E119 Type 2 diabetes mellitus without complications: Secondary | ICD-10-CM

## 2015-05-11 DIAGNOSIS — J45909 Unspecified asthma, uncomplicated: Secondary | ICD-10-CM | POA: Diagnosis not present

## 2015-05-11 DIAGNOSIS — M1990 Unspecified osteoarthritis, unspecified site: Secondary | ICD-10-CM | POA: Diagnosis not present

## 2015-05-11 DIAGNOSIS — Z87891 Personal history of nicotine dependence: Secondary | ICD-10-CM | POA: Diagnosis not present

## 2015-05-11 DIAGNOSIS — F419 Anxiety disorder, unspecified: Secondary | ICD-10-CM | POA: Insufficient documentation

## 2015-05-11 DIAGNOSIS — I10 Essential (primary) hypertension: Secondary | ICD-10-CM | POA: Diagnosis not present

## 2015-05-11 DIAGNOSIS — G4730 Sleep apnea, unspecified: Secondary | ICD-10-CM | POA: Insufficient documentation

## 2015-05-11 DIAGNOSIS — F329 Major depressive disorder, single episode, unspecified: Secondary | ICD-10-CM | POA: Insufficient documentation

## 2015-05-11 DIAGNOSIS — L97222 Non-pressure chronic ulcer of left calf with fat layer exposed: Secondary | ICD-10-CM | POA: Diagnosis not present

## 2015-05-11 DIAGNOSIS — B354 Tinea corporis: Secondary | ICD-10-CM | POA: Diagnosis not present

## 2015-05-11 DIAGNOSIS — E11622 Type 2 diabetes mellitus with other skin ulcer: Secondary | ICD-10-CM | POA: Diagnosis not present

## 2015-05-11 NOTE — Progress Notes (Signed)
Diabetes Self-Management Education  Visit Type:  Follow-up  Appt. Start Time: 1030 Appt. End Time: 1130  05/11/2015  [name], identified by name and date of birth, is a 65 y.o. female with a diagnosis of Diabetes:  .   ASSESSMENT  Blood pressure 110/78, height  (1.651 m), weight 180 lb 1.6 oz (81.693 kg). Body mass index is 29.97 kg/(m^2).       Diabetes Self-Management Education - 05/11/15 1028    Complications   How often do you check your blood sugar? 1-2 times/day   Fasting Blood glucose range (mg/dL) 16-109   Postprandial Blood glucose range (mg/dL) 60-454   Number of hypoglycemic episodes per month --  occasional symptoms at the end of the work shift (3rd) -- light headed, nausea   Have you had a dilated eye exam in the past 12 months? Yes   Have you had a dental exam in the past 12 months? No  will schedule soon   Are you checking your feet? Yes   How many days per week are you checking your feet? 7   Exercise   Exercise Type Light (walking / raking leaves)  does housecleaning for work during the night   How many days per week to you exercise? 5   How many minutes per day do you exercise? 120  or more; working 8 hours nightly   Total minutes per week of exercise 600   Patient Education   Nutrition management  Role of diet in the treatment of diabetes and the relationship between the three main macronutrients and blood glucose level;Food label reading, portion sizes and measuring food.;Meal timing in regards to the patients' current diabetes medication.;Meal options for control of blood glucose level and chronic complications.;Other (comment)  Provided guidance for 1600kcal meal plan; advised further decrease in overall sugar intake.   Physical activity and exercise  Role of exercise on diabetes management, blood pressure control and cardiac health.   Acute complications Discussed and identified patients' treatment of hyperglycemia.   Outcomes   Program  Status Completed      Learning Objective:  Patient will have a greater understanding of diabetes self-management. Patient education plan is to attend individual and/or group sessions per assessed needs and concerns.   Plan:   Patient Instructions   Work to further decrease sugar, sugary foods like Little Debbie cakes, Mt. Dew, and other sugar-sweetened drinks and foods.  Try 1/2 sandwich, yogurt with frozen fruit, low-sugar granola bars, crackers with peanut butter, cheese or nuts for snacks.   Make sure to eat a substantial snack or light meal when working during the night. OK to have a protein drink such as [employer]     Expected Outcomes:  Demonstrated interest in learning. Expect positive outcomes  Education material provided: Planning A Balanced Meal, Quick and Healthy Meal Ideas  If problems or questions, patient to contact team via:  Phone  Future DSME appointment: -  none schedule, patient has completed refresher program.

## 2015-05-11 NOTE — Patient Instructions (Signed)
   Work to further decrease sugar, sugary foods like Little Debbie cakes, Mt. Dew, and other sugar-sweetened drinks and foods.  Try 1/2 sandwich, yogurt with frozen fruit, low-sugar granola bars, crackers with peanut butter, cheese or nuts for snacks.   Make sure to eat a substantial snack or light meal when working during the night. OK to have a protein drink such as [employer]

## 2015-05-26 ENCOUNTER — Encounter: Payer: Self-pay | Admitting: Internal Medicine

## 2015-05-26 ENCOUNTER — Encounter: Payer: Self-pay | Admitting: *Deleted

## 2015-05-26 NOTE — Progress Notes (Signed)
[facility] and left msg for Fransico Setters, NP. Kim had referred pt to Dr. Donneta Romberg. MD reviewed chart. This appointment is not medically necessary. Msg sent to cancer center schedulers to cnl this appointment and to let the patient know per v/o Dr. Donneta Romberg.

## 2015-05-26 NOTE — Progress Notes (Signed)
Reviewed the patient's labs-from the GI clinic/polyclonal gammopathy with IgA elevation. No M protein present. I would not recommend evaluation in the cancer Center unless for other reasons. Okay to call me to discuss.

## 2015-05-27 ENCOUNTER — Ambulatory Visit: Payer: BLUE CROSS/BLUE SHIELD | Admitting: Certified Registered Nurse Anesthetist

## 2015-05-27 ENCOUNTER — Encounter
Admission: RE | Disposition: A | Discharge status: Home / Self Care | Payer: Self-pay | Source: Ambulatory Visit | Attending: Unknown Physician Specialty

## 2015-05-27 ENCOUNTER — Ambulatory Visit
Admission: RE | Admit: 2015-05-27 | Discharge: 2015-05-27 | Disposition: A | Discharge status: Home / Self Care | Payer: BLUE CROSS/BLUE SHIELD | Source: Ambulatory Visit | Attending: Unknown Physician Specialty | Admitting: Unknown Physician Specialty

## 2015-05-27 ENCOUNTER — Encounter: Payer: Self-pay | Admitting: Certified Registered Nurse Anesthetist

## 2015-05-27 DIAGNOSIS — Z86010 Personal history of colonic polyps: Secondary | ICD-10-CM | POA: Insufficient documentation

## 2015-05-27 DIAGNOSIS — Z7951 Long term (current) use of inhaled steroids: Secondary | ICD-10-CM | POA: Diagnosis not present

## 2015-05-27 DIAGNOSIS — G608 Other hereditary and idiopathic neuropathies: Secondary | ICD-10-CM | POA: Insufficient documentation

## 2015-05-27 DIAGNOSIS — Z7984 Long term (current) use of oral hypoglycemic drugs: Secondary | ICD-10-CM | POA: Insufficient documentation

## 2015-05-27 DIAGNOSIS — Z7982 Long term (current) use of aspirin: Secondary | ICD-10-CM | POA: Insufficient documentation

## 2015-05-27 DIAGNOSIS — E785 Hyperlipidemia, unspecified: Secondary | ICD-10-CM | POA: Diagnosis not present

## 2015-05-27 DIAGNOSIS — Z8049 Family history of malignant neoplasm of other genital organs: Secondary | ICD-10-CM | POA: Diagnosis not present

## 2015-05-27 DIAGNOSIS — J309 Allergic rhinitis, unspecified: Secondary | ICD-10-CM | POA: Diagnosis not present

## 2015-05-27 DIAGNOSIS — D649 Anemia, unspecified: Secondary | ICD-10-CM | POA: Diagnosis not present

## 2015-05-27 DIAGNOSIS — F39 Unspecified mood [affective] disorder: Secondary | ICD-10-CM | POA: Insufficient documentation

## 2015-05-27 DIAGNOSIS — K259 Gastric ulcer, unspecified as acute or chronic, without hemorrhage or perforation: Secondary | ICD-10-CM | POA: Diagnosis not present

## 2015-05-27 DIAGNOSIS — Z8379 Family history of other diseases of the digestive system: Secondary | ICD-10-CM | POA: Diagnosis not present

## 2015-05-27 DIAGNOSIS — Z8249 Family history of ischemic heart disease and other diseases of the circulatory system: Secondary | ICD-10-CM | POA: Diagnosis not present

## 2015-05-27 DIAGNOSIS — Z880 Allergy status to penicillin: Secondary | ICD-10-CM | POA: Diagnosis not present

## 2015-05-27 DIAGNOSIS — R21 Rash and other nonspecific skin eruption: Secondary | ICD-10-CM | POA: Diagnosis not present

## 2015-05-27 DIAGNOSIS — G43909 Migraine, unspecified, not intractable, without status migrainosus: Secondary | ICD-10-CM | POA: Diagnosis not present

## 2015-05-27 DIAGNOSIS — G4730 Sleep apnea, unspecified: Secondary | ICD-10-CM | POA: Insufficient documentation

## 2015-05-27 DIAGNOSIS — K2950 Unspecified chronic gastritis without bleeding: Secondary | ICD-10-CM | POA: Insufficient documentation

## 2015-05-27 DIAGNOSIS — K317 Polyp of stomach and duodenum: Secondary | ICD-10-CM | POA: Diagnosis not present

## 2015-05-27 DIAGNOSIS — Z9841 Cataract extraction status, right eye: Secondary | ICD-10-CM | POA: Diagnosis not present

## 2015-05-27 DIAGNOSIS — F329 Major depressive disorder, single episode, unspecified: Secondary | ICD-10-CM | POA: Insufficient documentation

## 2015-05-27 DIAGNOSIS — Z888 Allergy status to other drugs, medicaments and biological substances status: Secondary | ICD-10-CM | POA: Insufficient documentation

## 2015-05-27 DIAGNOSIS — Z79899 Other long term (current) drug therapy: Secondary | ICD-10-CM | POA: Insufficient documentation

## 2015-05-27 DIAGNOSIS — Z800 Family history of malignant neoplasm of digestive organs: Secondary | ICD-10-CM | POA: Diagnosis not present

## 2015-05-27 DIAGNOSIS — I10 Essential (primary) hypertension: Secondary | ICD-10-CM | POA: Insufficient documentation

## 2015-05-27 DIAGNOSIS — Z881 Allergy status to other antibiotic agents status: Secondary | ICD-10-CM | POA: Insufficient documentation

## 2015-05-27 DIAGNOSIS — Z8349 Family history of other endocrine, nutritional and metabolic diseases: Secondary | ICD-10-CM | POA: Diagnosis not present

## 2015-05-27 DIAGNOSIS — Z1211 Encounter for screening for malignant neoplasm of colon: Secondary | ICD-10-CM | POA: Insufficient documentation

## 2015-05-27 DIAGNOSIS — M1990 Unspecified osteoarthritis, unspecified site: Secondary | ICD-10-CM | POA: Diagnosis not present

## 2015-05-27 DIAGNOSIS — K640 First degree hemorrhoids: Secondary | ICD-10-CM | POA: Insufficient documentation

## 2015-05-27 DIAGNOSIS — E559 Vitamin D deficiency, unspecified: Secondary | ICD-10-CM | POA: Diagnosis not present

## 2015-05-27 DIAGNOSIS — Z90710 Acquired absence of both cervix and uterus: Secondary | ICD-10-CM | POA: Diagnosis not present

## 2015-05-27 DIAGNOSIS — K5730 Diverticulosis of large intestine without perforation or abscess without bleeding: Secondary | ICD-10-CM | POA: Insufficient documentation

## 2015-05-27 DIAGNOSIS — E119 Type 2 diabetes mellitus without complications: Secondary | ICD-10-CM | POA: Diagnosis not present

## 2015-05-27 DIAGNOSIS — J45909 Unspecified asthma, uncomplicated: Secondary | ICD-10-CM | POA: Insufficient documentation

## 2015-05-27 DIAGNOSIS — G5600 Carpal tunnel syndrome, unspecified upper limb: Secondary | ICD-10-CM | POA: Insufficient documentation

## 2015-05-27 HISTORY — DX: Vitamin D deficiency, unspecified: E55.9

## 2015-05-27 HISTORY — DX: Personal history of other specified conditions: Z87.898

## 2015-05-27 HISTORY — DX: Migraine, unspecified, not intractable, without status migrainosus: G43.909

## 2015-05-27 HISTORY — DX: Allergic rhinitis, unspecified: J30.9

## 2015-05-27 HISTORY — DX: Personal history of other diseases of the nervous system and sense organs: Z86.69

## 2015-05-27 HISTORY — DX: Anemia, unspecified: D64.9

## 2015-05-27 HISTORY — DX: Polyp of colon: K63.5

## 2015-05-27 HISTORY — DX: Hereditary and idiopathic neuropathy, unspecified: G60.9

## 2015-05-27 HISTORY — PX: ESOPHAGOGASTRODUODENOSCOPY (EGD) WITH PROPOFOL: SHX5813

## 2015-05-27 HISTORY — DX: Other microscopic hematuria: R31.29

## 2015-05-27 HISTORY — DX: Unspecified mood (affective) disorder: F39

## 2015-05-27 HISTORY — PX: COLONOSCOPY WITH PROPOFOL: SHX5780

## 2015-05-27 LAB — GLUCOSE, CAPILLARY: GLUCOSE-CAPILLARY: 94 mg/dL (ref 65–99)

## 2015-05-27 SURGERY — COLONOSCOPY WITH PROPOFOL
Anesthesia: General

## 2015-05-27 MED ORDER — GLYCOPYRROLATE 0.2 MG/ML IJ SOLN
INTRAMUSCULAR | Status: DC | PRN
  Administered 2015-05-27: .2 mg via INTRAVENOUS

## 2015-05-27 MED ORDER — DEXMEDETOMIDINE HCL IN NACL 400 MCG/100ML IV SOLN
INTRAVENOUS | Status: DC | PRN
  Administered 2015-05-27: 4 ug via INTRAVENOUS
  Administered 2015-05-27: 8 ug via INTRAVENOUS
  Administered 2015-05-27: 4 ug via INTRAVENOUS

## 2015-05-27 MED ORDER — SODIUM CHLORIDE 0.9 % IV SOLN: INTRAVENOUS | Status: DC

## 2015-05-27 MED ORDER — LIDOCAINE HCL (CARDIAC) 20 MG/ML IV SOLN
INTRAVENOUS | Status: DC | PRN
  Administered 2015-05-27: 80 mg via INTRATRACHEAL

## 2015-05-27 MED ORDER — SODIUM CHLORIDE 0.9 % IV SOLN
INTRAVENOUS | Status: DC
  Administered 2015-05-27: 09:00:00 via INTRAVENOUS

## 2015-05-27 MED ORDER — PROPOFOL 500 MG/50ML IV EMUL
INTRAVENOUS | Status: DC | PRN
  Administered 2015-05-27: 75 ug/kg/min via INTRAVENOUS

## 2015-05-27 MED ORDER — PROPOFOL 10 MG/ML IV BOLUS
INTRAVENOUS | Status: DC | PRN
  Administered 2015-05-27: 20 mg via INTRAVENOUS
  Administered 2015-05-27: 50 mg via INTRAVENOUS
  Administered 2015-05-27: 20 mg via INTRAVENOUS
  Administered 2015-05-27: 50 mg via INTRAVENOUS
  Administered 2015-05-27: 10 mg via INTRAVENOUS
  Administered 2015-05-27: 20 mg via INTRAVENOUS

## 2015-05-27 NOTE — Anesthesia Preprocedure Evaluation (Signed)
Anesthesia Evaluation  Patient identified by MRN, date of birth, ID band Patient awake    Reviewed: Allergy & Precautions, H&P , NPO status , Patient's Chart, lab work & pertinent test results, reviewed documented beta blocker date and time   History of Anesthesia Complications Negative for: history of anesthetic complications  Airway Mallampati: III  TM Distance: >3 FB Neck ROM: full    Dental no notable dental hx. (+) Missing, Chipped Crown and top left permanent bridge:   Pulmonary neg shortness of breath, asthma , sleep apnea , neg COPD, neg recent URI,    Pulmonary exam normal breath sounds clear to auscultation       Cardiovascular Exercise Tolerance: Good hypertension, On Medications (-) angina(-) CAD, (-) Past MI, (-) Cardiac Stents and (-) CABG Normal cardiovascular exam(-) dysrhythmias (-) Valvular Problems/Murmurs Rhythm:regular Rate:Normal     Neuro/Psych neg Seizures PSYCHIATRIC DISORDERS (Depression)  Neuromuscular disease (peripheral neuropathy)    GI/Hepatic Neg liver ROS, GERD  ,  Endo/Other  diabetes  Renal/GU negative Renal ROS  negative genitourinary   Musculoskeletal   Abdominal   Peds  Hematology negative hematology ROS (+)   Anesthesia Other Findings Past Medical History:   Diabetes mellitus without complication (HCC)                 Hypertension                                                 Hyperlipidemia                                               Depression                                                   Allergic rhinitis                                              Comment:with eczema   Mood disorder (HCC)                                            Comment:followed by Dr. 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   Idiopathic peripheral neuropathy (HCC)  Anemia                                                       Vitamin D deficiency                                         Asthma                                                         Comment:without status asthmaticus; with allergic               component   Colon polyps                                                 History of chest pain                                          Comment:negative stress test 05/2004   Migraine                                                     Osteoarthritis                                                 Comment:with history of whiplash   Sleep apnea                                                    Comment:AHI 43.6, CPAP of 11 initiated   Reproductive/Obstetrics negative OB ROS                             Anesthesia Physical Anesthesia Plan  ASA: III  Anesthesia Plan: General   Post-op Pain Management:    Induction:   Airway Management Planned:   Additional Equipment:   Intra-op Plan:   Post-operative Plan:   Informed Consent: I have reviewed the patients History and Physical, chart, labs and discussed the procedure including the risks, benefits and alternatives for the proposed anesthesia with the patient or authorized representative who has indicated his/her understanding and acceptance.   Dental Advisory Given  Plan Discussed with: Anesthesiologist, CRNA and Surgeon  Anesthesia Plan Comments:         Anesthesia Quick Evaluation

## 2015-05-27 NOTE — Op Note (Signed)
[facility] Gastroenterology Patient Name: Desiree Ramirez Procedure Date: 05/27/2015 9:03 AM MRN: [id number] Account #: [id number] Date of Birth: Mar 15, 1951 Admit Type: Outpatient Age: 65 Room: [facility] ENDO ROOM 1 Gender: Female Note Status: Finalized Procedure:         Colonoscopy Indications:       High risk colon cancer surveillance: Personal history of                     colonic polyps Providers:         Scot Jun, MD Referring MD:      Daniel Nones, MD (Referring MD) Medicines:         Propofol per Anesthesia Complications:     No immediate complications. Procedure:         Pre-Anesthesia Assessment:                    - After reviewing the risks and benefits, the patient was                     deemed in satisfactory condition to undergo the procedure.                    After obtaining informed consent, the colonoscope was                     passed under direct vision. Throughout the procedure, the                     patient's blood pressure, pulse, and oxygen saturations                     were monitored continuously. The Colonoscope was                     introduced through the anus and advanced to the the cecum,                     identified by appendiceal orifice and ileocecal valve. The                     colonoscopy was performed without difficulty. The patient                     tolerated the procedure well. The quality of the bowel                     preparation was good. Findings:      Multiple small and large-mouthed diverticula were found in the sigmoid       colon and in the descending colon.      Internal hemorrhoids were found during endoscopy. The hemorrhoids were       small and Grade I (internal hemorrhoids that do not prolapse).      The exam was otherwise without abnormality. Impression:        - Diverticulosis in the sigmoid colon and in the                     descending colon.                    - Internal hemorrhoids.                    - The examination was otherwise normal.                    -  No specimens collected. Recommendation:    - Repeat colonoscopy in 5 years for surveillance. Scot Jun, MD 05/27/2015 10:02:09 AM This report has been signed electronically. Number of Addenda: 0 Note Initiated On: 05/27/2015 9:03 AM Scope Withdrawal Time: 0 hours 11 minutes 12 seconds  Total Procedure Duration: 0 hours 16 minutes 53 seconds       [facility]

## 2015-05-27 NOTE — Op Note (Signed)
[facility] Gastroenterology Patient Name: Desiree Ramirez Procedure Date: 05/27/2015 9:02 AM MRN: [id number] Account #: [id number] Date of Birth: 1950/05/13 Admit Type: Outpatient Age: 65 Room: [facility] ENDO ROOM 1 Gender: Female Note Status: Finalized Procedure:         Upper GI endoscopy Indications:       abnormal rash suggesting celiac disease Providers:         Scot Jun, MD Referring MD:      Daniel Nones, MD (Referring MD) Complications:     No immediate complications. Procedure:         Pre-Anesthesia Assessment:                    - After reviewing the risks and benefits, the patient was                     deemed in satisfactory condition to undergo the procedure.                    After obtaining informed consent, the endoscope was passed                     under direct vision. Throughout the procedure, the                     patient's blood pressure, pulse, and oxygen saturations                     were monitored continuously. The Endoscope was introduced                     through the mouth, and advanced to the second part of                     duodenum. The upper GI endoscopy was accomplished without                     difficulty. The patient tolerated the procedure well. Findings:      The examined esophagus was normal. GEJ 36cm.      A single small sessile polyp with no bleeding and no stigmata of recent       bleeding was found in the cardia. Biopsies were taken with a cold       forceps for histology.      Diffuse and patchy mild inflammation characterized by erythema and       granularity was found in the gastric body. Biopsies were taken with a       cold forceps for histology. Biopsies were taken with a cold forceps for       Helicobacter pylori testing.      Localized severe inflammation characterized by erosions, erythema and       granularity was found in the gastric antrum. Biopsies were taken with a       cold forceps for  histology. Biopsies were taken with a cold forceps for       Helicobacter pylori testing.      Few non-bleeding cratered gastric ulcers with no stigmata of bleeding       were found in the gastric antrum. The largest lesion was 4 mm in largest       dimension. Biopsies were taken with a cold forceps for histology.       Biopsies were taken with a cold forceps for Helicobacter pylori testing.  The examined duodenum was normal. Biopsies for histology were taken with       a cold forceps for for evaluation of celiac disease. 2ed portion and       bulb. Impression:        - Normal esophagus.                    - A single gastric polyp. Biopsied.                    - Gastritis. Biopsied.                    - Gastritis. Biopsied.                    - Gastric ulcers with clean base. Biopsied.                    - Normal examined duodenum. Biopsied. Recommendation:    - Await pathology results. Take ulcer medicine, repeat EGD                     in 3 months. Scot Jun, MD 05/27/2015 9:41:23 AM This report has been signed electronically. Number of Addenda: 0 Note Initiated On: 05/27/2015 9:02 AM      [facility]

## 2015-05-27 NOTE — H&P (Signed)
Primary Care Physician:  Lynnea Ferrier, MD Primary Gastroenterologist:  Dr. Mechele Collin  Pre-Procedure History & Physical: HPI:  Desiree Ramirez is a 65 y.o. female is here for an endoscopy and colonoscopy.   Past Medical History  Diagnosis Date  . Diabetes mellitus without complication (HCC)   . Hypertension   . Hyperlipidemia   . Depression   . Allergic rhinitis     with eczema  . Mood disorder (HCC)     followed by Dr. Caryn Section  . History of carpal tunnel syndrome     bilateral  . 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 Idiopathic peripheral neuropathy (HCC)   . Anemia   . Vitamin D deficiency   . Asthma     without status asthmaticus; with allergic component  . Colon polyps   . History of chest pain     negative stress test 05/2004  . Migraine   . Osteoarthritis     with history of whiplash  . Sleep apnea     AHI 43.6, CPAP of 11 initiated    Past Surgical History  Procedure Laterality Date  . Cataract extraction Bilateral   . Carpal tunnel release Bilateral   . Colonoscopy  10/16/2005    adenomatous polyp  . Abdominal hysterectomy      TAH/BSO with uterine enlargement, fibroids    Prior to Admission medications   Medication Sig Start Date End Date Taking? Authorizing Provider  terbinafine (LAMISIL) 1 % cream Apply 1 application topically daily.   Yes Historical Provider, MD  albuterol (VENTOLIN HFA) 108 (90 BASE) MCG/ACT inhaler INHALE TWO PUFFS BY MOUTH 4 TIMES DAILY AS NEEDED FOR WHEEZING 06/30/14   Historical Provider, MD  amphetamine-dextroamphetamine (ADDERALL) 15 MG tablet Take 15 mg by mouth 2 (two) times daily.     Historical Provider, MD  aspirin EC 325 MG tablet Take 3 tablets by mouth daily.    Historical Provider, MD  ASPIRIN-ACETAMINOPHEN-CAFFEINE PO Take 2 tablets by mouth as needed.    Historical Provider, MD  buPROPion (WELLBUTRIN SR) 150 MG 12 hr tablet Take 150 mg by mouth daily.     Historical  Provider, MD  Fluticasone-Salmeterol (ADVAIR DISKUS) 250-50 MCG/DOSE AEPB INHALE ONE DOSE BY MOUTH TWICE DAILY 04/05/15   Historical Provider, MD  hydrOXYzine (ATARAX/VISTARIL) 50 MG tablet Take 50 mg by mouth as needed.     Historical Provider, MD  losartan-hydrochlorothiazide (HYZAAR) 50-12.5 MG tablet TAKE ONE TABLET BY MOUTH AT BEDTIME 10/14/14   Historical Provider, MD  metFORMIN (GLUCOPHAGE) 500 MG tablet TAKE ONE TABLET BY MOUTH AT BEDTIME 10/28/14   Historical Provider, MD  metroNIDAZOLE (METROGEL) 0.75 % gel APPLY TOPICALLY TWICE DAILY AS NEEDED FOR RASH 05/03/14   Historical Provider, MD  Nystatin (NYAMYC) 100000 UNIT/GM POWD APPLY 1 LIBERALLY TO SKIN 2-3 TIMES A DAY TO AFFECTED AREAS 03/11/15   Historical Provider, MD  SAPHRIS 5 MG SUBL 24 hr tablet Place 5 mg under the tongue at bedtime. 03/31/15   Historical Provider, MD  sertraline (ZOLOFT) 100 MG tablet Take 200 mg by mouth daily.     Historical Provider, MD  simvastatin (ZOCOR) 80 MG tablet TAKE ONE TABLET BY MOUTH AT BEDTIME 01/25/15   Historical Provider, MD  triamcinolone ointment (KENALOG) 0.1 % APPLY  A SMALL AMOUNT TO AFFECTED AREA TWICE DAILY 04/05/15   Historical Provider, MD  valACYclovir (VALTREX) 1000 MG tablet TAKE TWO TABLETS BY MOUTH TWICE DAILY AS DIRECTED  07/01/14   Historical Provider, MD    Allergies as of 05/10/2015 - Review Complete 04/12/2015  Allergen Reaction Noted  . Triptans Other (See Comments) 04/12/2015  . Amoxicillin Rash 04/12/2015  . Penicillin v potassium Rash 04/12/2015    Family History  Problem Relation Age of Onset  . Diabetes Mother   . Diabetes Brother     Social History   Social History  . Marital Status: Married    Spouse Name: N/A  . Number of Children: N/A  . Years of Education: N/A   Occupational History  . Not on file.   Social History Main Topics  . Smoking status: Never Smoker   . Smokeless tobacco: Never Used  . Alcohol Use: 0.0 - 0.6 oz/week    0-1 Standard drinks or  equivalent per week     Comment: special occasions  . Drug Use: Not on file  . Sexual Activity: Not on file   Other Topics Concern  . Not on file   Social History Narrative    Review of Systems: See HPI, otherwise negative ROS  Physical Exam: BP 131/86 mmHg  Pulse 71  Temp(Src) 98.2 F (36.8 C) (Oral)  Resp 17  Ht  (1.651 m)  Wt 81.647 kg (180 lb)  BMI 29.95 kg/m2  SpO2 99% General:   Alert,  pleasant and cooperative in NAD Head:  Normocephalic and atraumatic. Neck:  Supple; no masses or thyromegaly. Lungs:  Clear throughout to auscultation.    Heart:  Regular rate and rhythm. Abdomen:  Soft, nontender and nondistended. Normal bowel sounds, without guarding, and without rebound.   Neurologic:  Alert and  oriented x4;  grossly normal neurologically.  Impression/Plan: AVIAH SORCI is here for an endoscopy and colonoscopy to be performed for [facility] colon polyps and blood tests showing possible celiac disease  Risks, benefits, limitations, and alternatives regarding  endoscopy and colonoscopy have been reviewed with the patient.  Questions have been answered.  All parties agreeable.   Lynnae Prude, MD  05/27/2015, 9:06 AM

## 2015-05-27 NOTE — Anesthesia Postprocedure Evaluation (Signed)
Anesthesia Post Note  Patient: Desiree Ramirez  Procedure(s) Performed: Procedure(s) (LRB): COLONOSCOPY WITH PROPOFOL (N/A) ESOPHAGOGASTRODUODENOSCOPY (EGD) WITH PROPOFOL (N/A)  Patient location during evaluation: PACU Anesthesia Type: General Level of consciousness: awake, awake and alert and oriented Pain management: pain level controlled Vital Signs Assessment: vitals unstable and post-procedure vital signs reviewed and stable Respiratory status: spontaneous breathing, nonlabored ventilation and respiratory function stable Cardiovascular status: blood pressure returned to baseline and stable Postop Assessment: no headache and no signs of nausea or vomiting Anesthetic complications: no    Last Vitals:  Filed Vitals:   05/27/15 0846 05/27/15 1004  BP: 131/86   Pulse: 71   Temp: 36.8 C 36.1 C  Resp: 17     Last Pain: There were no vitals filed for this visit.               Mitzie Na

## 2015-05-27 NOTE — Transfer of Care (Signed)
Immediate Anesthesia Transfer of Care Note  Patient: Desiree Ramirez  Procedure(s) Performed: Procedure(s): COLONOSCOPY WITH PROPOFOL (N/A) ESOPHAGOGASTRODUODENOSCOPY (EGD) WITH PROPOFOL (N/A)  Patient Location: PACU  Anesthesia Type:General  Level of Consciousness: awake, alert  and oriented  Airway & Oxygen Therapy: Patient Spontanous Breathing  Post-op Assessment: Report given to RN, Post -op Vital signs reviewed and stable and Patient moving all extremities X 4  Post vital signs: Reviewed and stable  Last Vitals:  Filed Vitals:   05/27/15 0846 05/27/15 1004  BP: 131/86   Pulse: 71   Temp: 36.8 C 36.1 C  Resp: 17     Complications: No apparent anesthesia complications

## 2015-05-30 ENCOUNTER — Inpatient Hospital Stay: Payer: BLUE CROSS/BLUE SHIELD | Admitting: Internal Medicine

## 2015-05-30 LAB — SURGICAL PATHOLOGY

## 2015-06-09 ENCOUNTER — Encounter: Payer: Self-pay | Admitting: *Deleted

## 2015-06-09 ENCOUNTER — Inpatient Hospital Stay: Admission: RE | Admit: 2015-06-09 | Payer: BLUE CROSS/BLUE SHIELD | Source: Ambulatory Visit

## 2015-06-09 NOTE — Patient Instructions (Signed)
  Your procedure is scheduled on:06/17/15 Report to Day Surgery. [facility] SECOND FLOOR To find out your arrival time please call [phone] between 1PM - 3PM on 06/16/15.  Remember: Instructions that are not followed completely may result in serious medical risk, up to and including death, or upon the discretion of your surgeon and anesthesiologist your surgery may need to be rescheduled.    __X__ 1. Do not eat food or drink liquids after midnight. No gum chewing or hard candies.     ___X_ 2. No Alcohol for 24 hours before or after surgery.   ____ 3. Bring all medications with you on the day of surgery if instructed.    __X__ 4. Notify your doctor if there is any change in your medical condition     (cold, fever, infections).     Do not wear jewelry, make-up, hairpins, clips or nail polish.  Do not wear lotions, powders, or perfumes. You may wear deodorant.  Do not shave 48 hours prior to surgery. Men may shave face and neck.  Do not bring valuables to the hospital.    [facility] is not responsible for any belongings or valuables.               Contacts, dentures or bridgework may not be worn into surgery.  Leave your suitcase in the car. After surgery it may be brought to your room.  For patients admitted to the hospital, discharge time is determined by your                treatment team.   Patients discharged the day of surgery will not be allowed to drive home.   Please read over the following fact sheets that you were given:   Surgical Site Infection Prevention   _X_ Take these medicines the morning of surgery with A SIP OF WATER:    1. OMEPRAZOLE AT BEDTIME 06/16/15 AND AM OF SURGERY  2.   3.   4.  5.  6.  ____ Fleet Enema (as directed)   _X_ Use CHG Soap as directed  _X___ Use inhalers on the day of surgery  _X___ Stop metformin 2 days prior to surgery    ____ Take 1/2 of usual insulin dose the night before surgery and none on the morning of surgery.    ____ Stop Coumadin/Plavix/aspirin on  ____ Stop Anti-inflammatories on  ____ Stop supplements until after surgery.    ____ Bring C-Pap to the hospital.

## 2015-06-13 ENCOUNTER — Encounter
Admission: RE | Admit: 2015-06-13 | Discharge: 2015-06-13 | Disposition: A | Discharge status: Home / Self Care | Payer: BLUE CROSS/BLUE SHIELD | Source: Ambulatory Visit | Attending: Surgery | Admitting: Surgery

## 2015-06-13 DIAGNOSIS — Z01810 Encounter for preprocedural cardiovascular examination: Secondary | ICD-10-CM | POA: Insufficient documentation

## 2015-06-13 DIAGNOSIS — I10 Essential (primary) hypertension: Secondary | ICD-10-CM | POA: Insufficient documentation

## 2015-06-13 DIAGNOSIS — Z01812 Encounter for preprocedural laboratory examination: Secondary | ICD-10-CM | POA: Diagnosis not present

## 2015-06-17 ENCOUNTER — Ambulatory Visit: Payer: BLUE CROSS/BLUE SHIELD | Admitting: Anesthesiology

## 2015-06-17 ENCOUNTER — Encounter: Payer: Self-pay | Admitting: *Deleted

## 2015-06-17 ENCOUNTER — Ambulatory Visit
Admission: RE | Admit: 2015-06-17 | Discharge: 2015-06-17 | Disposition: A | Discharge status: Home / Self Care | Payer: BLUE CROSS/BLUE SHIELD | Source: Ambulatory Visit | Attending: Surgery | Admitting: Surgery

## 2015-06-17 ENCOUNTER — Encounter
Admission: RE | Disposition: A | Discharge status: Home / Self Care | Payer: Self-pay | Source: Ambulatory Visit | Attending: Surgery

## 2015-06-17 DIAGNOSIS — J45909 Unspecified asthma, uncomplicated: Secondary | ICD-10-CM | POA: Insufficient documentation

## 2015-06-17 DIAGNOSIS — G609 Hereditary and idiopathic neuropathy, unspecified: Secondary | ICD-10-CM | POA: Diagnosis not present

## 2015-06-17 DIAGNOSIS — D649 Anemia, unspecified: Secondary | ICD-10-CM | POA: Insufficient documentation

## 2015-06-17 DIAGNOSIS — M1990 Unspecified osteoarthritis, unspecified site: Secondary | ICD-10-CM | POA: Diagnosis not present

## 2015-06-17 DIAGNOSIS — F329 Major depressive disorder, single episode, unspecified: Secondary | ICD-10-CM | POA: Insufficient documentation

## 2015-06-17 DIAGNOSIS — Z9842 Cataract extraction status, left eye: Secondary | ICD-10-CM | POA: Insufficient documentation

## 2015-06-17 DIAGNOSIS — Z833 Family history of diabetes mellitus: Secondary | ICD-10-CM | POA: Diagnosis not present

## 2015-06-17 DIAGNOSIS — E119 Type 2 diabetes mellitus without complications: Secondary | ICD-10-CM | POA: Insufficient documentation

## 2015-06-17 DIAGNOSIS — Z79899 Other long term (current) drug therapy: Secondary | ICD-10-CM | POA: Insufficient documentation

## 2015-06-17 DIAGNOSIS — E559 Vitamin D deficiency, unspecified: Secondary | ICD-10-CM | POA: Insufficient documentation

## 2015-06-17 DIAGNOSIS — Z881 Allergy status to other antibiotic agents status: Secondary | ICD-10-CM | POA: Diagnosis not present

## 2015-06-17 DIAGNOSIS — Z7951 Long term (current) use of inhaled steroids: Secondary | ICD-10-CM | POA: Insufficient documentation

## 2015-06-17 DIAGNOSIS — Z86010 Personal history of colonic polyps: Secondary | ICD-10-CM | POA: Diagnosis not present

## 2015-06-17 DIAGNOSIS — Z9841 Cataract extraction status, right eye: Secondary | ICD-10-CM | POA: Insufficient documentation

## 2015-06-17 DIAGNOSIS — G4730 Sleep apnea, unspecified: Secondary | ICD-10-CM | POA: Insufficient documentation

## 2015-06-17 DIAGNOSIS — E785 Hyperlipidemia, unspecified: Secondary | ICD-10-CM | POA: Diagnosis not present

## 2015-06-17 DIAGNOSIS — Z888 Allergy status to other drugs, medicaments and biological substances status: Secondary | ICD-10-CM | POA: Diagnosis not present

## 2015-06-17 DIAGNOSIS — K4090 Unilateral inguinal hernia, without obstruction or gangrene, not specified as recurrent: Secondary | ICD-10-CM | POA: Insufficient documentation

## 2015-06-17 DIAGNOSIS — Z7984 Long term (current) use of oral hypoglycemic drugs: Secondary | ICD-10-CM | POA: Insufficient documentation

## 2015-06-17 DIAGNOSIS — J309 Allergic rhinitis, unspecified: Secondary | ICD-10-CM | POA: Insufficient documentation

## 2015-06-17 DIAGNOSIS — I10 Essential (primary) hypertension: Secondary | ICD-10-CM | POA: Diagnosis not present

## 2015-06-17 DIAGNOSIS — F39 Unspecified mood [affective] disorder: Secondary | ICD-10-CM | POA: Insufficient documentation

## 2015-06-17 DIAGNOSIS — Z90710 Acquired absence of both cervix and uterus: Secondary | ICD-10-CM | POA: Diagnosis not present

## 2015-06-17 DIAGNOSIS — G43909 Migraine, unspecified, not intractable, without status migrainosus: Secondary | ICD-10-CM | POA: Diagnosis not present

## 2015-06-17 HISTORY — PX: INGUINAL HERNIA REPAIR: SHX194

## 2015-06-17 LAB — GLUCOSE, CAPILLARY
GLUCOSE-CAPILLARY: 95 mg/dL (ref 65–99)
Glucose-Capillary: 104 mg/dL — ABNORMAL HIGH (ref 65–99)

## 2015-06-17 SURGERY — REPAIR, HERNIA, INGUINAL, ADULT
Anesthesia: General | Laterality: Right | Wound class: Clean

## 2015-06-17 MED ORDER — PHENYLEPHRINE HCL 10 MG/ML IJ SOLN
INTRAMUSCULAR | Status: DC | PRN
  Administered 2015-06-17: 200 ug via INTRAVENOUS
  Administered 2015-06-17: 100 ug via INTRAVENOUS

## 2015-06-17 MED ORDER — VANCOMYCIN HCL IN DEXTROSE 1-5 GM/200ML-% IV SOLN
INTRAVENOUS | Status: AC
  Administered 2015-06-17: 1000 mg via INTRAVENOUS
  Filled 2015-06-17: qty 200

## 2015-06-17 MED ORDER — ONDANSETRON HCL 4 MG/2ML IJ SOLN
INTRAMUSCULAR | Status: DC | PRN
  Administered 2015-06-17: 4 mg via INTRAVENOUS

## 2015-06-17 MED ORDER — HYDROCODONE-ACETAMINOPHEN 5-325 MG PO TABS
ORAL_TABLET | ORAL | Status: DC
Start: 2015-06-17 — End: 2015-06-17
  Filled 2015-06-17: qty 2

## 2015-06-17 MED ORDER — DEXAMETHASONE SODIUM PHOSPHATE 10 MG/ML IJ SOLN
INTRAMUSCULAR | Status: DC | PRN
  Administered 2015-06-17: 5 mg via INTRAVENOUS

## 2015-06-17 MED ORDER — MIDAZOLAM HCL 2 MG/2ML IJ SOLN
INTRAMUSCULAR | Status: DC | PRN
  Administered 2015-06-17: 2 mg via INTRAVENOUS

## 2015-06-17 MED ORDER — KETOROLAC TROMETHAMINE 30 MG/ML IJ SOLN
INTRAMUSCULAR | Status: DC | PRN
  Administered 2015-06-17: 30 mg via INTRAVENOUS

## 2015-06-17 MED ORDER — FLUCONAZOLE 150 MG PO TABS: 150.0000 mg | ORAL_TABLET | Freq: Once | ORAL | Status: DC

## 2015-06-17 MED ORDER — BUPIVACAINE-EPINEPHRINE (PF) 0.5% -1:200000 IJ SOLN
INTRAMUSCULAR | Status: DC | PRN
  Administered 2015-06-17: 15 mL

## 2015-06-17 MED ORDER — FENTANYL CITRATE (PF) 100 MCG/2ML IJ SOLN
INTRAMUSCULAR | Status: AC
  Administered 2015-06-17: 25 ug via INTRAVENOUS
  Filled 2015-06-17: qty 2

## 2015-06-17 MED ORDER — GLYCOPYRROLATE 0.2 MG/ML IJ SOLN
INTRAMUSCULAR | Status: DC | PRN
  Administered 2015-06-17: .6 mg via INTRAVENOUS
  Administered 2015-06-17: 0.2 mg via INTRAVENOUS

## 2015-06-17 MED ORDER — NEOSTIGMINE METHYLSULFATE 10 MG/10ML IV SOLN
INTRAVENOUS | Status: DC | PRN
  Administered 2015-06-17: 4 mg via INTRAVENOUS

## 2015-06-17 MED ORDER — SODIUM CHLORIDE 0.9 % IV SOLN
INTRAVENOUS | Status: DC
  Administered 2015-06-17 (×3): via INTRAVENOUS

## 2015-06-17 MED ORDER — PROPOFOL 10 MG/ML IV BOLUS
INTRAVENOUS | Status: DC | PRN
  Administered 2015-06-17: 200 mg via INTRAVENOUS

## 2015-06-17 MED ORDER — LACTATED RINGERS IV SOLN: INTRAVENOUS | Status: DC | PRN

## 2015-06-17 MED ORDER — ONDANSETRON HCL 4 MG/2ML IJ SOLN: 4.0000 mg | Freq: Once | INTRAMUSCULAR | Status: DC | PRN

## 2015-06-17 MED ORDER — VANCOMYCIN HCL IN DEXTROSE 1-5 GM/200ML-% IV SOLN
1000.0000 mg | INTRAVENOUS | Status: AC
  Administered 2015-06-17: 1000 mg via INTRAVENOUS

## 2015-06-17 MED ORDER — HYDROCODONE-ACETAMINOPHEN 5-325 MG PO TABS
1.0000 | ORAL_TABLET | ORAL | Status: DC | PRN
  Administered 2015-06-17: 2 via ORAL

## 2015-06-17 MED ORDER — ROCURONIUM BROMIDE 100 MG/10ML IV SOLN
INTRAVENOUS | Status: DC | PRN
  Administered 2015-06-17: 40 mg via INTRAVENOUS

## 2015-06-17 MED ORDER — BUPIVACAINE-EPINEPHRINE (PF) 0.5% -1:200000 IJ SOLN
INTRAMUSCULAR | Status: AC
  Filled 2015-06-17: qty 30

## 2015-06-17 MED ORDER — LIDOCAINE HCL (CARDIAC) 20 MG/ML IV SOLN
INTRAVENOUS | Status: DC | PRN
  Administered 2015-06-17: 30 mg via INTRAVENOUS

## 2015-06-17 MED ORDER — EPHEDRINE SULFATE 50 MG/ML IJ SOLN
INTRAMUSCULAR | Status: DC | PRN
  Administered 2015-06-17 (×2): 20 mg via INTRAVENOUS
  Administered 2015-06-17: 10 mg via INTRAVENOUS

## 2015-06-17 MED ORDER — FENTANYL CITRATE (PF) 100 MCG/2ML IJ SOLN
INTRAMUSCULAR | Status: DC | PRN
  Administered 2015-06-17: 50 ug via INTRAVENOUS
  Administered 2015-06-17: 100 ug via INTRAVENOUS
  Administered 2015-06-17 (×2): 50 ug via INTRAVENOUS

## 2015-06-17 MED ORDER — HYDROCODONE-ACETAMINOPHEN 5-325 MG PO TABS: 1.0000 | ORAL_TABLET | ORAL | Status: DC | PRN

## 2015-06-17 MED ORDER — FENTANYL CITRATE (PF) 100 MCG/2ML IJ SOLN
25.0000 ug | INTRAMUSCULAR | Status: AC | PRN
  Administered 2015-06-17 (×6): 25 ug via INTRAVENOUS

## 2015-06-17 SURGICAL SUPPLY — 25 items
BLADE SURG 15 STRL LF DISP TIS (BLADE) ×1 IMPLANT
BLADE SURG 15 STRL SS (BLADE) ×2
CANISTER SUCT 1200ML W/VALVE (MISCELLANEOUS) ×3 IMPLANT
CHLORAPREP W/TINT 26ML (MISCELLANEOUS) ×3 IMPLANT
DRAIN PENROSE 5/8X18 LTX STRL (WOUND CARE) IMPLANT
DRAPE LAPAROTOMY 77X122 PED (DRAPES) ×3 IMPLANT
ELECT REM PT RETURN 9FT ADLT (ELECTROSURGICAL) ×3
ELECTRODE REM PT RTRN 9FT ADLT (ELECTROSURGICAL) ×1 IMPLANT
GLOVE BIO SURGEON STRL SZ7.5 (GLOVE) ×18 IMPLANT
GOWN STRL REUS W/ TWL LRG LVL3 (GOWN DISPOSABLE) ×3 IMPLANT
GOWN STRL REUS W/TWL LRG LVL3 (GOWN DISPOSABLE) ×6
KIT RM TURNOVER STRD PROC AR (KITS) ×3 IMPLANT
LABEL OR SOLS (LABEL) ×3 IMPLANT
LIQUID BAND (GAUZE/BANDAGES/DRESSINGS) ×3 IMPLANT
MESH SYNTHETIC 4X6 SOFT BARD (Mesh General) ×1 IMPLANT
MESH SYNTHETIC SOFT BARD 4X6 (Mesh General) ×2 IMPLANT
NEEDLE HYPO 25X1 1.5 SAFETY (NEEDLE) ×3 IMPLANT
NS IRRIG 500ML POUR BTL (IV SOLUTION) ×3 IMPLANT
PACK BASIN MINOR ARMC (MISCELLANEOUS) ×3 IMPLANT
SUT CHROMIC 4 0 RB 1X27 (SUTURE) ×3 IMPLANT
SUT MNCRL AB 4-0 PS2 18 (SUTURE) ×3 IMPLANT
SUT SURGILON 0 30 BLK (SUTURE) ×9 IMPLANT
SUT VIC AB 4-0 SH 27 (SUTURE) ×2
SUT VIC AB 4-0 SH 27XANBCTRL (SUTURE) ×1 IMPLANT
SYRINGE 10CC LL (SYRINGE) ×3 IMPLANT

## 2015-06-17 NOTE — Discharge Instructions (Signed)
Take Tylenol or Norco if needed for pain.  May shower.  Avoid straining and heavy lifting.  AMBULATORY SURGERY  DISCHARGE INSTRUCTIONS   1) The drugs that you were given will stay in your system until tomorrow so for the next 24 hours you should not:  A) Drive an automobile B) Make any legal decisions C) Drink any alcoholic beverage   2) You may resume regular meals tomorrow.  Today it is better to start with liquids and gradually work up to solid foods.  You may eat anything you prefer, but it is better to start with liquids, then soup and crackers, and gradually work up to solid foods.   3) Please notify your doctor immediately if you have any unusual bleeding, trouble breathing, redness and pain at the surgery site, drainage, fever, or pain not relieved by medication.    4) Additional Instructions:   Please contact your physician with any problems or Same Day Surgery at [phone], Monday through Friday 6 am to 4 pm, or [place] [facility] number at [phone].  Open Hernia Repair, Care After Refer to this sheet in the next few weeks. These instructions provide you with information on caring for yourself after your procedure. Your health care provider may also give you more specific instructions. Your treatment has been planned according to current medical practices, but problems sometimes occur. Call your health care provider if you have any problems or questions after your procedure. WHAT TO EXPECT AFTER THE PROCEDURE After your procedure, it is typical to have the following:  Pain in your abdomen, especially along your incision. You will be given pain medicines to control the pain.  Constipation. You may be given a stool softener to help prevent this. HOME CARE INSTRUCTIONS  Only take over-the-counter or prescription medicines as directed by your health care provider.  Keep the incision area dry and clean. You may wash the incision area gently with soap  and water 48 hours after surgery. Gently blot or dab the incision area dry. Do not take baths, use swimming pools, or use hot tubs for 10 days or until your health care provider approves.  Change bandages (dressings) as directed by your health care provider.  Continue your normal diet as directed by your health care provider. Eat plenty of fruits and vegetables to help prevent constipation.  Drink enough fluids to keep your urine clear or pale yellow. This also helps prevent constipation.  Do not drive until your health care provider says it is okay.  Do not lift anything heavier than 10 lb (4.5 kg) or play contact sports for 4 weeks or until your health care provider approves.  Follow up with your health care provider as directed. Ask your health care provider when to make an appointment to have your stitches (sutures) or staples removed. SEEK MEDICAL CARE IF:  You have increased bleeding coming from the incision site.  You have blood in your stool.  You have increasing pain in the incision area.  You see redness or swelling in the incision area.  You have fluid (pus) coming from the incision.  You have a fever.  You notice a bad smell coming from the incision area or dressing. SEEK IMMEDIATE MEDICAL CARE IF:  You develop a rash.  You have chest pain or shortness of breath.  You feel lightheaded or feel faint.   This information is not intended to replace advice given to you by your health care provider. Make sure you discuss  any questions you have with your health care provider.   Document Released: 10/27/2004 Document Revised: 04/30/2014 Document Reviewed: 11/19/2012 Elsevier Interactive Patient Education [employer]

## 2015-06-17 NOTE — Transfer of Care (Signed)
Immediate Anesthesia Transfer of Care Note  Patient: Desiree Ramirez  Procedure(s) Performed: Procedure(s): HERNIA REPAIR INGUINAL ADULT (Right)  Patient Location: PACU  Anesthesia Type:General  Level of Consciousness: awake, alert  and oriented  Airway & Oxygen Therapy: Patient Spontanous Breathing and Patient connected to nasal cannula oxygen  Post-op Assessment: Report given to RN  Post vital signs: Reviewed and stable  Last Vitals:  Filed Vitals:   06/17/15 0617  BP: 130/83  Pulse: 70  Temp: 37.4 C  Resp: 18    Complications: No apparent anesthesia complications

## 2015-06-17 NOTE — Anesthesia Preprocedure Evaluation (Signed)
Anesthesia Evaluation  Patient identified by MRN, date of birth, ID band Patient awake    Reviewed: Allergy & Precautions, H&P , NPO status , Patient's Chart, lab work & pertinent test results, reviewed documented beta blocker date and time   History of Anesthesia Complications Negative for: history of anesthetic complications  Airway Mallampati: III  TM Distance: >3 FB Neck ROM: full    Dental no notable dental hx. (+) Missing, Chipped Crown and top left permanent bridge:   Pulmonary neg shortness of breath, asthma , sleep apnea , neg COPD, neg recent URI,    Pulmonary exam normal breath sounds clear to auscultation       Cardiovascular Exercise Tolerance: Good hypertension, Pt. on medications (-) angina(-) CAD, (-) Past MI, (-) Cardiac Stents and (-) CABG Normal cardiovascular exam(-) dysrhythmias (-) Valvular Problems/Murmurs Rhythm:regular Rate:Normal     Neuro/Psych  Headaches, neg Seizures PSYCHIATRIC DISORDERS (Depression) Depression Peripheral neuropathy  Neuromuscular disease    GI/Hepatic Neg liver ROS, GERD  ,  Endo/Other  diabetes, Well Controlled, Type 2, Oral Hypoglycemic Agents  Renal/GU negative Renal ROS  negative genitourinary   Musculoskeletal  (+) Arthritis , Osteoarthritis,    Abdominal   Peds negative pediatric ROS (+)  Hematology negative hematology ROS (+) anemia ,   Anesthesia Other Findings Past Medical History:   Diabetes mellitus without complication (HCC)                 Hypertension                                                 Hyperlipidemia                                               Depression                                                   Allergic rhinitis                                              Comment:with eczema   Mood disorder (HCC)                                            Comment:followed by Dr. 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   Idiopathic peripheral neuropathy (HCC)                       Anemia                                                       Vitamin D deficiency                                         Asthma                                                         Comment:without status asthmaticus; with allergic               component   Colon polyps                                                 History of chest pain                                          Comment:negative stress test 05/2004   Migraine                                                     Osteoarthritis                                                 Comment:with history of whiplash   Sleep apnea                                                    Comment:AHI 43.6, CPAP of 11 initiated   Reproductive/Obstetrics negative OB ROS                             Anesthesia Physical  Anesthesia Plan  ASA: III  Anesthesia Plan: General   Post-op Pain Management:    Induction: Intravenous  Airway Management Planned: Oral ETT  Additional Equipment:   Intra-op Plan:   Post-operative Plan: Extubation in OR  Informed Consent: I have reviewed the patients History and Physical, chart, labs and discussed the procedure including the risks, benefits and alternatives for the proposed anesthesia with the patient or authorized representative who has indicated his/her understanding and acceptance.   Dental advisory given  Plan Discussed with: CRNA  and Surgeon  Anesthesia Plan Comments:        Anesthesia Quick Evaluation

## 2015-06-17 NOTE — Anesthesia Procedure Notes (Signed)
Procedure Name: Intubation Date/Time: 06/17/2015 8:05 AM Performed by: Lanier Ensign Pre-anesthesia Checklist: Patient identified, Patient being monitored, Timeout performed, Emergency Drugs available and Suction available Patient Re-evaluated:Patient Re-evaluated prior to inductionOxygen Delivery Method: Circle system utilized Preoxygenation: Pre-oxygenation with 100% oxygen Intubation Type: IV induction Ventilation: Mask ventilation without difficulty Laryngoscope Size: Mac, 3 and Glidescope Grade View: Grade IV Tube type: Oral Tube size: 7.0 mm Number of attempts: 2 Airway Equipment and Method: Stylet,  Patient positioned with wedge pillow and Video-laryngoscopy Placement Confirmation: ETT inserted through vocal cords under direct vision,  positive ETCO2 and breath sounds checked- equal and bilateral Secured at: 21 cm Tube secured with: Tape Dental Injury: Teeth and Oropharynx as per pre-operative assessment  Difficulty Due To: Difficulty was anticipated, Difficult Airway- due to anterior larynx and Difficult Airway- due to reduced neck mobility

## 2015-06-17 NOTE — H&P (Signed)
Desiree Ramirez is an 65 y.o. female.   Chief Complaint: Bulging in the right groin  HPI: She has a 2 month history of bulging in the right groin. She was seen in the office 1 month ago and found to have a right inguinal hernia she reports moderate discomfort at the site.  Past Medical History  Diagnosis Date  . Diabetes mellitus without complication (HCC)   . Hypertension   . Hyperlipidemia   . Depression   . Allergic rhinitis     with eczema  . Mood disorder (HCC)     followed by Dr. Caryn Ramirez  . History of carpal tunnel syndrome     bilateral  . 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 Anemia   . Vitamin D deficiency   . Asthma     without status asthmaticus; with allergic component  . Colon polyps   . History of chest pain     negative stress test 05/2004  . Migraine   . Osteoarthritis     with history of whiplash  . Sleep apnea     AHI 43.6, CPAP of 11 initiated  . Idiopathic peripheral neuropathy [facility])     Past Surgical History  Procedure Laterality Date  . Cataract extraction Bilateral   . Carpal tunnel release Bilateral   . Colonoscopy  10/16/2005    adenomatous polyp  . Abdominal hysterectomy      TAH/BSO with uterine enlargement, fibroids  . Colonoscopy with propofol N/A 05/27/2015    Procedure: COLONOSCOPY WITH PROPOFOL;  Surgeon: Desiree Jun, MD;  Location: [facility] ENDOSCOPY;  Service: Endoscopy;  Laterality: N/A;  . Esophagogastroduodenoscopy (egd) with propofol N/A 05/27/2015    Procedure: ESOPHAGOGASTRODUODENOSCOPY (EGD) WITH PROPOFOL;  Surgeon: Desiree Jun, MD;  Location: [facility] ENDOSCOPY;  Service: Endoscopy;  Laterality: N/A;    Family History  Problem Relation Age of Onset  . Diabetes Mother   . Diabetes Brother    Social History:  reports that she has never smoked. She has never used smokeless tobacco. She reports that she drinks alcohol. She reports that she does not use illicit drugs.  Allergies:   Allergies  Allergen Reactions  . Triptans Other (See Comments)    hyperactivity  . Amoxicillin Rash  . Penicillin V Potassium Rash    Medications Prior to Admission  Medication Sig Dispense Refill  . albuterol (VENTOLIN HFA) 108 (90 BASE) MCG/ACT inhaler INHALE TWO PUFFS BY MOUTH 4 TIMES DAILY AS NEEDED FOR WHEEZING    . amphetamine-dextroamphetamine (ADDERALL) 15 MG tablet Take 15 mg by mouth 2 (two) times daily.     Marland Kitchen buPROPion (WELLBUTRIN SR) 150 MG 12 hr tablet Take 150 mg by mouth daily.     . Fluticasone-Salmeterol (ADVAIR DISKUS) 250-50 MCG/DOSE AEPB INHALE ONE DOSE BY MOUTH TWICE DAILY    . hydrOXYzine (ATARAX/VISTARIL) 50 MG tablet Take 50 mg by mouth as needed.     Marland Kitchen losartan-hydrochlorothiazide (HYZAAR) 50-12.5 MG tablet TAKE ONE TABLET BY MOUTH AT BEDTIME    . metFORMIN (GLUCOPHAGE) 500 MG tablet TAKE ONE TABLET BY MOUTH AT BEDTIME    . metroNIDAZOLE (METROGEL) 0.75 % gel APPLY TOPICALLY TWICE DAILY AS NEEDED FOR RASH    . omeprazole (PRILOSEC) 40 MG capsule Take 40 mg by mouth.    Marland Kitchen SAPHRIS 5 MG SUBL 24 hr tablet Place 5 mg under the tongue at bedtime.  0  . sertraline (ZOLOFT) 100 MG tablet Take 200 mg  by mouth daily.     . simvastatin (ZOCOR) 80 MG tablet TAKE ONE TABLET BY MOUTH AT BEDTIME    . triamcinolone ointment (KENALOG) 0.1 % APPLY  A SMALL AMOUNT TO AFFECTED AREA TWICE DAILY    . valACYclovir (VALTREX) 1000 MG tablet TAKE TWO TABLETS BY MOUTH TWICE DAILY AS DIRECTED      Results for orders placed or performed during the hospital encounter of 06/17/15 (from the past 48 hour(s))  Glucose, capillary     Status: None   Collection Time: 06/17/15  6:15 AM  Result Value Ref Range   Glucose-Capillary 95 65 - 99 mg/dL   No results found.  ROS: She has developed a rash which she thinks may be related to detergent. Some of the rash is identified in the right flank. She reports no other change in condition since the day of the office visit.  Blood pressure 130/83,  pulse 70, temperature 99.4 F (37.4 C), resp. rate 18, SpO2 97 %. Physical Exam she is awake alert and oriented resting on the stretcher.  Pupils equal reactive to light external movements are intact, sclerae are clear, pharynx clear.  NECK with no palpable mass  LUNG sounds clear to auscultation.  HEART: Regular rhythm S1 and S2 without murmur  ABDOMEN obese soft with a palpable bulge in the right groin which is accentuated with coughing.  EXTREMITIES no dependent edema  NEUROLOGIC awake alert and oriented and moving all extremities.  Lab work were reviewed with patient  Assessment/Plan Right inguinal hernia  I discussed the plan for right inguinal hernia repair  Renda Rolls, MD 06/17/2015, 7:33 AM

## 2015-06-17 NOTE — Anesthesia Postprocedure Evaluation (Signed)
Anesthesia Post Note  Patient: Desiree Ramirez  Procedure(s) Performed: Procedure(s) (LRB): HERNIA REPAIR INGUINAL ADULT (Right)  Patient location during evaluation: PACU Anesthesia Type: General Level of consciousness: awake and alert and oriented Pain management: pain level controlled Vital Signs Assessment: post-procedure vital signs reviewed and stable Respiratory status: spontaneous breathing Cardiovascular status: blood pressure returned to baseline Anesthetic complications: no    Last Vitals:  Filed Vitals:   06/17/15 1048 06/17/15 1126  BP: 133/69 116/81  Pulse: 66 63  Temp:    Resp: 16 16    Last Pain:  Filed Vitals:   06/17/15 1127  PainSc: 6                  Felis Quillin

## 2015-06-17 NOTE — Op Note (Signed)
OPERATIVE REPORT  PREOPERATIVE  DIAGNOSIS: Right inguinal hernia.  POSTOPERATIVE DIAGNOSIS: . Right inguinal hernia  PROCEDURE: . Right inguinal hernia repair  ANESTHESIA:  General  SURGEON: Renda Rolls  MD   INDICATIONS: . She reports a history of bulging and discomfort in the right groin. A right inguinal hernia was demonstrated on physical exam and repair was recommended for definitive treatment.  With the patient on the operating table in the supine position under general endotracheal anesthesia the right lower abdomen was prepared with ChloraPrep and draped in a sterile manner.  A right lower quadrant transversely oriented suprapubic incision was made and carried down through subcutaneous tissues. One traversing vein was divided between 4-0 chromic suture ligatures. Several small bleeding points were cauterized. Scarpa's fascia was incised. The external ring was identified. There was some bulging of the hernia sac protruding through the external ring and the external oblique aponeurosis was incised along the course of its fibers to open the external ring and expose the floor of the inguinal canal. There was a direct hernia sac which was dissected free from surrounding structures. The sac was approximately 3 cm in dimension and was inverted back into the abdominal cavity. The floor of the inguinal canal consistent of fascia which was thin. The fascial ring defect was approximately 1.8 cm. This was repaired with 0 Surgilon interrupted sutures. Next an onlay Bard soft mesh was cut to create an oval shape of some 4 x 6 cm and placed over the floor of the inguinal canal. It was sutured medial to the repair and also sutured to the shelving edge of the inguinal ligament. Next the external oblique aponeurosis was closed over the mesh with running 4-0 Vicryl. The deep fascia superior and lateral to the repair site was infiltrated with half percent Sensorcaine with epinephrine. Subcutaneous tissues were  also infiltrated. The Scarpa's fascia was closed with interrupted 4-0 Vicryl. The skin was closed with running 4-0 Monocryl subcutaneous suture and LiquiBand. The patient tolerated surgery satisfactorily and was then prepared for transfer to the recovery room  [facility].D.

## 2015-06-17 NOTE — Progress Notes (Signed)
Patient has an ongoing red rash to her abdomen area, dry patches that are red on her hands and a large Red scaly area to her left shin.

## 2015-10-18 DIAGNOSIS — Z8711 Personal history of peptic ulcer disease: Secondary | ICD-10-CM | POA: Insufficient documentation

## 2016-05-10 ENCOUNTER — Emergency Department
Admission: EM | Admit: 2016-05-10 | Discharge: 2016-05-10 | Disposition: A | Discharge status: Home / Self Care | Payer: BLUE CROSS/BLUE SHIELD | Attending: Emergency Medicine | Admitting: Emergency Medicine

## 2016-05-10 ENCOUNTER — Encounter: Payer: Self-pay | Admitting: Emergency Medicine

## 2016-05-10 ENCOUNTER — Emergency Department: Payer: BLUE CROSS/BLUE SHIELD

## 2016-05-10 DIAGNOSIS — J45901 Unspecified asthma with (acute) exacerbation: Secondary | ICD-10-CM

## 2016-05-10 DIAGNOSIS — Z7982 Long term (current) use of aspirin: Secondary | ICD-10-CM | POA: Diagnosis not present

## 2016-05-10 DIAGNOSIS — E119 Type 2 diabetes mellitus without complications: Secondary | ICD-10-CM | POA: Diagnosis not present

## 2016-05-10 DIAGNOSIS — I10 Essential (primary) hypertension: Secondary | ICD-10-CM | POA: Diagnosis not present

## 2016-05-10 DIAGNOSIS — Z79899 Other long term (current) drug therapy: Secondary | ICD-10-CM | POA: Insufficient documentation

## 2016-05-10 DIAGNOSIS — R0602 Shortness of breath: Secondary | ICD-10-CM | POA: Diagnosis present

## 2016-05-10 MED ORDER — AZITHROMYCIN 250 MG PO TABS: ORAL_TABLET | ORAL | 0 refills | Status: DC

## 2016-05-10 MED ORDER — PREDNISONE 20 MG PO TABS: 40.0000 mg | ORAL_TABLET | Freq: Every day | ORAL | 0 refills | Status: DC

## 2016-05-10 MED ORDER — PREDNISONE 20 MG PO TABS
60.0000 mg | ORAL_TABLET | ORAL | Status: AC
  Administered 2016-05-10: 60 mg via ORAL
  Filled 2016-05-10: qty 3

## 2016-05-10 MED ORDER — ALBUTEROL SULFATE (2.5 MG/3ML) 0.083% IN NEBU
5.0000 mg | INHALATION_SOLUTION | Freq: Once | RESPIRATORY_TRACT | Status: AC
  Administered 2016-05-10: 5 mg via RESPIRATORY_TRACT
  Filled 2016-05-10: qty 6

## 2016-05-10 MED ORDER — IPRATROPIUM-ALBUTEROL 0.5-2.5 (3) MG/3ML IN SOLN
3.0000 mL | Freq: Once | RESPIRATORY_TRACT | Status: AC
  Administered 2016-05-10: 3 mL via RESPIRATORY_TRACT
  Filled 2016-05-10: qty 3

## 2016-05-10 NOTE — ED Notes (Signed)
Pt ambulated in hallway with continuous pulse ox. Sat remained 94-97% for duration of walk. Pt reports slight increase in shortness of breath with ambulation but no further dyspnea or wheezing.

## 2016-05-10 NOTE — ED Notes (Signed)

## 2016-05-10 NOTE — ED Provider Notes (Signed)
[facility] Emergency Department Provider Note  ____________________________________________  Time seen: Approximately 1:55 PM  I have reviewed the triage vital signs and the nursing notes.   HISTORY  Chief Complaint Shortness of Breath    HPI Desiree Ramirez is a 66 y.o. female with a history of asthma who reports gradual onset of shortness of breath and wheezing yesterday. She's been using her Advair and rescue albuterol without relief. Last night around 3 AM it became much worse. Denies chest pain but does have a feeling of tightness related to her breathing difficulty. No cough fever sweats or chills. No trauma. Feels like asthma exacerbation to her. No history of DVT or PE.     Past Medical History:  Diagnosis Date  . Allergic rhinitis    with eczema  . Anemia   . Asthma    without status asthmaticus; with allergic component  . Colon polyps   . Depression   . Diabetes mellitus without complication (HCC)   . History of carpal tunnel syndrome    bilateral  . History of chest pain    negative stress test 05/2004  . Hyperlipidemia   . Hypertension   . Idiopathic peripheral neuropathy   . 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 Migraine   . Mood disorder (HCC)    followed by Dr. Caryn Section  . Osteoarthritis    with history of whiplash  . Sleep apnea    AHI 43.6, CPAP of 11 initiated  . Vitamin D deficiency      There are no active problems to display for this patient.    Past Surgical History:  Procedure Laterality Date  . ABDOMINAL HYSTERECTOMY     TAH/BSO with uterine enlargement, fibroids  . CARPAL TUNNEL RELEASE Bilateral   . CATARACT EXTRACTION Bilateral   . COLONOSCOPY  10/16/2005   adenomatous polyp  . COLONOSCOPY WITH PROPOFOL N/A 05/27/2015   Procedure: COLONOSCOPY WITH PROPOFOL;  Surgeon: Scot Jun, MD;  Location: [facility] ENDOSCOPY;  Service: Endoscopy;  Laterality: N/A;  .  ESOPHAGOGASTRODUODENOSCOPY (EGD) WITH PROPOFOL N/A 05/27/2015   Procedure: ESOPHAGOGASTRODUODENOSCOPY (EGD) WITH PROPOFOL;  Surgeon: Scot Jun, MD;  Location: [facility] ENDOSCOPY;  Service: Endoscopy;  Laterality: N/A;  . INGUINAL HERNIA REPAIR Right 06/17/2015   Procedure: HERNIA REPAIR INGUINAL ADULT;  Surgeon: Nadeen Landau, MD;  Location: [facility] ORS;  Service: General;  Laterality: Right;     Prior to Admission medications   Medication Sig Start Date End Date Taking? Authorizing Provider  acetaminophen (TYLENOL) 500 MG tablet Take 1,000 mg by mouth every 6 (six) hours as needed.   Yes Historical Provider, MD  albuterol (VENTOLIN HFA) 108 (90 BASE) MCG/ACT inhaler INHALE TWO PUFFS BY MOUTH 4 TIMES DAILY AS NEEDED FOR WHEEZING 06/30/14  Yes Historical Provider, MD  amphetamine-dextroamphetamine (ADDERALL) 15 MG tablet Take 15 mg by mouth 2 (two) times daily.    Yes Historical Provider, MD  aspirin 325 MG tablet Take 325 mg by mouth daily.   Yes Historical Provider, MD  buPROPion (WELLBUTRIN SR) 150 MG 12 hr tablet Take 150 mg by mouth daily.    Yes Historical Provider, MD  Fluticasone-Salmeterol (ADVAIR DISKUS) 250-50 MCG/DOSE AEPB INHALE ONE DOSE BY MOUTH TWICE DAILY 04/05/15  Yes Historical Provider, MD  losartan-hydrochlorothiazide (HYZAAR) 50-12.5 MG tablet TAKE ONE TABLET BY MOUTH AT BEDTIME 10/14/14  Yes Historical Provider, MD  metFORMIN (GLUCOPHAGE) 500 MG tablet TAKE ONE TABLET BY MOUTH AT BEDTIME  10/28/14  Yes Historical Provider, MD  metroNIDAZOLE (METROGEL) 0.75 % gel APPLY TOPICALLY TWICE DAILY AS NEEDED FOR RASH 05/03/14  Yes Historical Provider, MD  omeprazole (PRILOSEC) 40 MG capsule Take 40 mg by mouth.   Yes Historical Provider, MD  SAPHRIS 5 MG SUBL 24 hr tablet Place 5 mg under the tongue at bedtime. 03/31/15  Yes Historical Provider, MD  sertraline (ZOLOFT) 100 MG tablet Take 200 mg by mouth daily.    Yes Historical Provider, MD  simvastatin (ZOCOR) 80 MG tablet TAKE ONE  TABLET BY MOUTH AT BEDTIME 01/25/15  Yes Historical Provider, MD  triamcinolone ointment (KENALOG) 0.1 % APPLY  A SMALL AMOUNT TO AFFECTED AREA TWICE DAILY 04/05/15  Yes Historical Provider, MD  valACYclovir (VALTREX) 1000 MG tablet TAKE TWO TABLETS BY MOUTH TWICE DAILY AS DIRECTED 07/01/14  Yes Historical Provider, MD  azithromycin (ZITHROMAX Z-PAK) 250 MG tablet Take 2 tablets (500 mg) on  Day 1,  followed by 1 tablet (250 mg) once daily on Days 2 through 5. 05/10/16   Sharman Cheek, MD  HYDROcodone-acetaminophen (NORCO) 5-325 MG tablet Take 1-2 tablets by mouth every 4 (four) hours as needed for moderate pain. 06/17/15   Nadeen Landau, MD  hydrOXYzine (ATARAX/VISTARIL) 50 MG tablet Take 50 mg by mouth as needed.     Historical Provider, MD  predniSONE (DELTASONE) 20 MG tablet Take 2 tablets (40 mg total) by mouth daily. 05/10/16   Sharman Cheek, MD     Allergies Triptans; Amoxicillin; and Penicillin v potassium   Family History  Problem Relation Age of Onset  . Diabetes Mother   . Diabetes Brother     Social History Social History  Substance Use Topics  . Smoking status: Never Smoker  . Smokeless tobacco: Never Used  . Alcohol use 0.0 - 0.6 oz/week     Comment: special occasions    Review of Systems  Constitutional:   No fever or chills.  ENT:   No sore throat. No rhinorrhea. Cardiovascular:   No chest pain. Respiratory:   Positive shortness of breath without cough. Gastrointestinal:   Negative for abdominal pain, vomiting and diarrhea.  Genitourinary:   Negative for dysuria or difficulty urinating. Musculoskeletal:   Negative for focal pain or swelling Neurological:   Negative for headaches 10-point ROS otherwise negative.  ____________________________________________   PHYSICAL EXAM:  VITAL SIGNS: ED Triage Vitals  Enc Vitals Group     BP 05/10/16 1247 (!) 167/119     Pulse Rate 05/10/16 1247 94     Resp 05/10/16 1247 (!) 28     Temp --      Temp src  --      SpO2 05/10/16 1247 95 %     Weight 05/10/16 1248 180 lb (81.6 kg)     Height --      Head Circumference --      Peak Flow --      Pain Score --      Pain Loc --      Pain Edu? --      Excl. in GC? --     Vital signs reviewed, nursing assessments reviewed.   Constitutional:   Alert and oriented. Mild respiratory distress. Eyes:   No scleral icterus. No conjunctival pallor. PERRL. EOMI.  No nystagmus. ENT   Head:   Normocephalic and atraumatic.   Nose:   No congestion/rhinnorhea. No septal hematoma   Mouth/Throat:   MMM, no pharyngeal erythema. No peritonsillar mass.  Neck:   No stridor. No SubQ emphysema. No meningismus. Hematological/Lymphatic/Immunilogical:   No cervical lymphadenopathy. Cardiovascular:   RRR. Symmetric bilateral radial and DP pulses.  No murmurs.  Respiratory:   Increased work of breathing with accessory muscle use. Tachypnea. Diminished air entry, but symmetric breath sounds in all lung fields. Diffuse expiratory wheezing. Prolonged expiratory phase.. Gastrointestinal:   Soft and nontender. Non distended. There is no CVA tenderness.  No rebound, rigidity, or guarding. Genitourinary:   deferred Musculoskeletal:   Nontender with normal range of motion in all extremities. No joint effusions.  No lower extremity tenderness.  No edema. Neurologic:   Normal speech and language.  CN 2-10 normal. Motor grossly intact. No gross focal neurologic deficits are appreciated.  Skin:    Skin is warm, dry and intact. No rash noted.  No petechiae, purpura, or bullae.  ____________________________________________    LABS (pertinent positives/negatives) (all labs ordered are listed, but only abnormal results are displayed) Labs Reviewed - No data to display ____________________________________________   EKG    ____________________________________________    RADIOLOGY  Chest x-ray  unremarkable  ____________________________________________   PROCEDURES Procedures  ____________________________________________   INITIAL IMPRESSION / ASSESSMENT AND PLAN / ED COURSE  Pertinent labs & imaging results that were available during my care of the patient were reviewed by me and considered in my medical decision making (see chart for details).  Patient presents with shortness of breath and wheezing. With ambulation at triage had an initial room air sat of 89%. Presentation is consistent with asthma exacerbation. Low suspicion for heart failure ACS PE or dissection. No evidence of pneumothorax. We'll give steroids and nebs and reassess.     Clinical Course as of May 10 1446  Thu May 10, 2016  1423 Feels much better after nebs. Improved air entry. No accessory muscle use, normal WOB. Still mild expiratory wheeze.  Resting RA SO2 100%. Will ambulate and check pulse ox.   [PS]    Clinical Course User Index [PS] Sharman CheekPhillip Nyjah Schwake, MD    ----------------------------------------- 2:48 PM on 05/10/2016 -----------------------------------------  Patient ambulated with only mild shortness of breath. Lowest oxygen saturation 97%. Suitable for outpatient follow-up. Continue prednisone. With underlying lung disease we'll have her take azithromycin as well. Follow up with primary care. ____________________________________________   FINAL CLINICAL IMPRESSION(S) / ED DIAGNOSES  Final diagnoses:  Exacerbation of asthma, unspecified asthma severity, unspecified whether persistent      New Prescriptions   AZITHROMYCIN (ZITHROMAX Z-PAK) 250 MG TABLET    Take 2 tablets (500 mg) on  Day 1,  followed by 1 tablet (250 mg) once daily on Days 2 through 5.   PREDNISONE (DELTASONE) 20 MG TABLET    Take 2 tablets (40 mg total) by mouth daily.     Portions of this note were generated with dragon dictation software. Dictation errors may occur despite best attempts at proofreading.     Sharman CheekPhillip Mykala Mccready, MD 05/10/16 [phone]

## 2016-05-10 NOTE — ED Triage Notes (Addendum)
Pt reports SOB and asthma attack. Pt with labored breathing. Initial SpO2 89%RA. Pt audibly wheezing. Pt having difficulty speaking in complete sentences.

## 2016-05-21 ENCOUNTER — Other Ambulatory Visit: Payer: Self-pay | Admitting: Internal Medicine

## 2016-05-21 DIAGNOSIS — Z1231 Encounter for screening mammogram for malignant neoplasm of breast: Secondary | ICD-10-CM

## 2016-06-20 ENCOUNTER — Ambulatory Visit
Admission: RE | Admit: 2016-06-20 | Discharge: 2016-06-20 | Disposition: A | Discharge status: Home / Self Care | Payer: BLUE CROSS/BLUE SHIELD | Source: Ambulatory Visit | Attending: Internal Medicine | Admitting: Internal Medicine

## 2016-06-20 DIAGNOSIS — Z1231 Encounter for screening mammogram for malignant neoplasm of breast: Secondary | ICD-10-CM

## 2016-09-26 ENCOUNTER — Other Ambulatory Visit: Payer: Self-pay | Admitting: Student

## 2016-09-26 DIAGNOSIS — M25562 Pain in left knee: Secondary | ICD-10-CM

## 2017-05-27 ENCOUNTER — Other Ambulatory Visit: Payer: Self-pay | Admitting: Internal Medicine

## 2017-05-27 DIAGNOSIS — Z1231 Encounter for screening mammogram for malignant neoplasm of breast: Secondary | ICD-10-CM

## 2017-07-05 ENCOUNTER — Ambulatory Visit
Admission: RE | Admit: 2017-07-05 | Discharge: 2017-07-05 | Disposition: A | Discharge status: Home / Self Care | Payer: BLUE CROSS/BLUE SHIELD | Source: Ambulatory Visit | Attending: Internal Medicine | Admitting: Internal Medicine

## 2017-07-05 DIAGNOSIS — Z1231 Encounter for screening mammogram for malignant neoplasm of breast: Secondary | ICD-10-CM

## 2019-08-07 ENCOUNTER — Telehealth: Payer: Self-pay | Admitting: Nurse Practitioner

## 2019-08-07 ENCOUNTER — Other Ambulatory Visit: Payer: Self-pay | Admitting: Nurse Practitioner

## 2019-08-07 DIAGNOSIS — I10 Essential (primary) hypertension: Secondary | ICD-10-CM

## 2019-08-07 DIAGNOSIS — U071 COVID-19: Secondary | ICD-10-CM

## 2019-08-07 DIAGNOSIS — E119 Type 2 diabetes mellitus without complications: Secondary | ICD-10-CM

## 2019-08-07 MED ORDER — SODIUM CHLORIDE 0.9 % IV SOLN
Freq: Once | INTRAVENOUS | Status: AC
  Filled 2019-08-07: qty 700

## 2019-08-07 NOTE — Telephone Encounter (Signed)
Called to discuss with Desiree Ramirez about Covid symptoms and the use of bamlanivimab/etesevimab or casirivimab/imdevimabb, a monoclonal antibody infusion for those with mild to moderate Covid symptoms and at a high risk of hospitalization.     Pt is qualified for this infusion at the [facility] infusion center due to co-morbid conditions and/or a member of an at-risk group. Diabetes, hypertension, age >31.   She verbalized understanding of treatment and appointment details (08/08/19 @ 1030am).    There are no problems to display for this patient.   Willette Alma, AGPCNP-BC Pager: [phone] Amion: [name]

## 2019-08-07 NOTE — Progress Notes (Signed)
  I connected by phone with Desiree Ramirez on 08/07/2019 at 11:04 AM to discuss the potential use of an new treatment for mild to moderate COVID-19 viral infection in non-hospitalized patients.  This patient is a 69 y.o. female that meets the FDA criteria for Emergency Use Authorization of bamlanivimab/etesevimab or casirivimab/imdevimab.  Has a (+) direct SARS-CoV-2 viral test result  Has mild or moderate COVID-19   Is ? 69 years of age and weighs ? 40 kg  Is NOT hospitalized due to COVID-19  Is NOT requiring oxygen therapy or requiring an increase in baseline oxygen flow rate due to COVID-19  Is within 10 days of symptom onset  Has at least one of the high risk factor(s) for progression to severe COVID-19 and/or hospitalization as defined in EUA.  Specific high risk criteria : Diabetes, hypertension, age >31.    I have spoken and communicated the following to the patient or parent/caregiver:  1. FDA has authorized the emergency use of bamlanivimab/etesevimab and casirivimab\imdevimab for the treatment of mild to moderate COVID-19 in adults and pediatric patients with positive results of direct SARS-CoV-2 viral testing who are 11 years of age and older weighing at least 40 kg, and who are at high risk for progressing to severe COVID-19 and/or hospitalization.  2. The significant known and potential risks and benefits of bamlanivimab/etesevimab and casirivimab\imdevimab, and the extent to which such potential risks and benefits are unknown.  3. Information on available alternative treatments and the risks and benefits of those alternatives, including clinical trials.  4. Patients treated with bamlanivimab/etesevimab and casirivimab\imdevimab should continue to self-isolate and use infection control measures (e.g., wear mask, isolate, social distance, avoid sharing personal items, clean and disinfect "high touch" surfaces, and frequent handwashing) according to CDC guidelines.   5. The  patient or parent/caregiver has the option to accept or refuse bamlanivimab/etesevimab or casirivimab\imdevimab .  After reviewing this information with the patient, The patient agreed to proceed with receiving the bamlanimivab infusion and will be provided a copy of the Fact sheet prior to receiving the infusion.Desiree Ramirez 08/07/2019 11:04 AM

## 2019-08-08 ENCOUNTER — Ambulatory Visit (HOSPITAL_COMMUNITY)
Admission: RE | Admit: 2019-08-08 | Discharge: 2019-08-08 | Disposition: A | Discharge status: Home / Self Care | Payer: Medicare Other | Source: Ambulatory Visit | Attending: Pulmonary Disease | Admitting: Pulmonary Disease

## 2019-08-08 DIAGNOSIS — I10 Essential (primary) hypertension: Secondary | ICD-10-CM | POA: Insufficient documentation

## 2019-08-08 DIAGNOSIS — Z23 Encounter for immunization: Secondary | ICD-10-CM | POA: Insufficient documentation

## 2019-08-08 DIAGNOSIS — E119 Type 2 diabetes mellitus without complications: Secondary | ICD-10-CM | POA: Diagnosis present

## 2019-08-08 DIAGNOSIS — U071 COVID-19: Secondary | ICD-10-CM | POA: Diagnosis present

## 2019-08-08 MED ORDER — ALBUTEROL SULFATE HFA 108 (90 BASE) MCG/ACT IN AERS: 2.0000 | INHALATION_SPRAY | Freq: Once | RESPIRATORY_TRACT | Status: DC | PRN

## 2019-08-08 MED ORDER — DIPHENHYDRAMINE HCL 50 MG/ML IJ SOLN: 50.0000 mg | Freq: Once | INTRAMUSCULAR | Status: DC | PRN

## 2019-08-08 MED ORDER — METHYLPREDNISOLONE SODIUM SUCC 125 MG IJ SOLR: 125.0000 mg | Freq: Once | INTRAMUSCULAR | Status: DC | PRN

## 2019-08-08 MED ORDER — FAMOTIDINE IN NACL 20-0.9 MG/50ML-% IV SOLN: 20.0000 mg | Freq: Once | INTRAVENOUS | Status: DC | PRN

## 2019-08-08 MED ORDER — EPINEPHRINE 0.3 MG/0.3ML IJ SOAJ: 0.3000 mg | Freq: Once | INTRAMUSCULAR | Status: DC | PRN

## 2019-08-08 MED ORDER — SODIUM CHLORIDE 0.9 % IV SOLN: INTRAVENOUS | Status: DC | PRN

## 2019-08-08 NOTE — Progress Notes (Signed)
  Diagnosis: COVID-19  Physician:dr wright  Procedure: [facility] Med: bamlanivimab\etesevimab infusion - Provided patient with bamlanimivab\etesevimab fact sheet for patients, parents and caregivers prior to infusion.  Complications: No immediate complications noted.  Discharge: Discharged home   Tiah Heckel S Trinidad Petron 08/08/2019  

## 2019-08-08 NOTE — Discharge Instructions (Signed)
COVID-19 COVID-19 is a respiratory infection that is caused by a virus called severe acute respiratory syndrome coronavirus 2 (SARS-CoV-2). The disease is also known as coronavirus disease or novel coronavirus. In some people, the virus may not cause any symptoms. In others, it may cause a serious infection. The infection can get worse quickly and can lead to complications, such as:  Pneumonia, or infection of the lungs.  Acute respiratory distress syndrome or ARDS. This is a condition in which fluid build-up in the lungs prevents the lungs from filling with air and passing oxygen into the blood.  Acute respiratory failure. This is a condition in which there is not enough oxygen passing from the lungs to the body or when carbon dioxide is not passing from the lungs out of the body.  Sepsis or septic shock. This is a serious bodily reaction to an infection.  Blood clotting problems.  Secondary infections due to bacteria or fungus.  Organ failure. This is when your body's organs stop working. The virus that causes COVID-19 is contagious. This means that it can spread from person to person through droplets from coughs and sneezes (respiratory secretions). What are the causes? This illness is caused by a virus. You may catch the virus by:  Breathing in droplets from an infected person. Droplets can be spread by a person breathing, speaking, singing, coughing, or sneezing.  Touching something, like a table or a doorknob, that was exposed to the virus (contaminated) and then touching your mouth, nose, or eyes. What increases the risk? Risk for infection You are more likely to be infected with this virus if you:  Are within 6 feet (2 meters) of a person with COVID-19.  Provide care for or live with a person who is infected with COVID-19.  Spend time in crowded indoor spaces or live in shared housing. Risk for serious illness You are more likely to become seriously ill from the virus if  you:  Are 50 years of age or older. The higher your age, the more you are at risk for serious illness.  Live in a nursing home or long-term care facility.  Have cancer.  Have a long-term (chronic) disease such as: ? Chronic lung disease, including chronic obstructive pulmonary disease or asthma. ? A long-term disease that lowers your body's ability to fight infection (immunocompromised). ? Heart disease, including heart failure, a condition in which the arteries that lead to the heart become narrow or blocked (coronary artery disease), a disease which makes the heart muscle thick, weak, or stiff (cardiomyopathy). ? Diabetes. ? Chronic kidney disease. ? Sickle cell disease, a condition in which red blood cells have an abnormal "sickle" shape. ? Liver disease.  Are obese. What are the signs or symptoms? Symptoms of this condition can range from mild to severe. Symptoms may appear any time from 2 to 14 days after being exposed to the virus. They include:  A fever or chills.  A cough.  Difficulty breathing.  Headaches, body aches, or muscle aches.  Runny or stuffy (congested) nose.  A sore throat.  New loss of taste or smell. Some people may also have stomach problems, such as nausea, vomiting, or diarrhea. Other people may not have any symptoms of COVID-19. How is this diagnosed? This condition may be diagnosed based on:  Your signs and symptoms, especially if: ? You live in an area with a COVID-19 outbreak. ? You recently traveled to or from an area where the virus is common. ? You   provide care for or live with a person who was diagnosed with COVID-19. ? You were exposed to a person who was diagnosed with COVID-19.  A physical exam.  Lab tests, which may include: ? Taking a sample of fluid from the back of your nose and throat (nasopharyngeal fluid), your nose, or your throat using a swab. ? A sample of mucus from your lungs (sputum). ? Blood tests.  Imaging tests,  which may include, X-rays, CT scan, or ultrasound. How is this treated? At present, there is no medicine to treat COVID-19. Medicines that treat other diseases are being used on a trial basis to see if they are effective against COVID-19. Your health care provider will talk with you about ways to treat your symptoms. For most people, the infection is mild and can be managed at home with rest, fluids, and over-the-counter medicines. Treatment for a serious infection usually takes places in a hospital intensive care unit (ICU). It may include one or more of the following treatments. These treatments are given until your symptoms improve.  Receiving fluids and medicines through an IV.  Supplemental oxygen. Extra oxygen is given through a tube in the nose, a face mask, or a hood.  Positioning you to lie on your stomach (prone position). This makes it easier for oxygen to get into the lungs.  Continuous positive airway pressure (CPAP) or bi-level positive airway pressure (BPAP) machine. This treatment uses mild air pressure to keep the airways open. A tube that is connected to a motor delivers oxygen to the body.  Ventilator. This treatment moves air into and out of the lungs by using a tube that is placed in your windpipe.  Tracheostomy. This is a procedure to create a hole in the neck so that a breathing tube can be inserted.  Extracorporeal membrane oxygenation (ECMO). This procedure gives the lungs a chance to recover by taking over the functions of the heart and lungs. It supplies oxygen to the body and removes carbon dioxide. Follow these instructions at home: Lifestyle  If you are sick, stay home except to get medical care. Your health care provider will tell you how long to stay home. Call your health care provider before you go for medical care.  Rest at home as told by your health care provider.  Do not use any products that contain nicotine or tobacco, such as cigarettes,  e-cigarettes, and chewing tobacco. If you need help quitting, ask your health care provider.  Return to your normal activities as told by your health care provider. Ask your health care provider what activities are safe for you. General instructions  Take over-the-counter and prescription medicines only as told by your health care provider.  Drink enough fluid to keep your urine pale yellow.  Keep all follow-up visits as told by your health care provider. This is important. How is this prevented?  There is no vaccine to help prevent COVID-19 infection. However, there are steps you can take to protect yourself and others from this virus. To protect yourself:   Do not travel to areas where COVID-19 is a risk. The areas where COVID-19 is reported change often. To identify high-risk areas and travel restrictions, check the CDC travel website: wwwnc.cdc.gov/travel/notices  If you live in, or must travel to, an area where COVID-19 is a risk, take precautions to avoid infection. ? Stay away from people who are sick. ? Wash your hands often with soap and water for 20 seconds. If soap and water   are not available, use an alcohol-based hand sanitizer. ? Avoid touching your mouth, face, eyes, or nose. ? Avoid going out in public, follow guidance from your state and local health authorities. ? If you must go out in public, wear a cloth face covering or face mask. Make sure your mask covers your nose and mouth. ? Avoid crowded indoor spaces. Stay at least 6 feet (2 meters) away from others. ? Disinfect objects and surfaces that are frequently touched every day. This may include:  Counters and tables.  Doorknobs and light switches.  Sinks and faucets.  Electronics, such as phones, remote controls, keyboards, computers, and tablets. To protect others: If you have symptoms of COVID-19, take steps to prevent the virus from spreading to others.  If you think you have a COVID-19 infection, contact  your health care provider right away. Tell your health care team that you think you may have a COVID-19 infection.  Stay home. Leave your house only to seek medical care. Do not use public transport.  Do not travel while you are sick.  Wash your hands often with soap and water for 20 seconds. If soap and water are not available, use alcohol-based hand sanitizer.  Stay away from other members of your household. Let healthy household members care for children and pets, if possible. If you have to care for children or pets, wash your hands often and wear a mask. If possible, stay in your own room, separate from others. Use a different bathroom.  Make sure that all people in your household wash their hands well and often.  Cough or sneeze into a tissue or your sleeve or elbow. Do not cough or sneeze into your hand or into the air.  Wear a cloth face covering or face mask. Make sure your mask covers your nose and mouth. Where to find more information  Centers for Disease Control and Prevention: www.cdc.gov/coronavirus/2019-ncov/index.html  World Health Organization: www.who.int/health-topics/coronavirus Contact a health care provider if:  You live in or have traveled to an area where COVID-19 is a risk and you have symptoms of the infection.  You have had contact with someone who has COVID-19 and you have symptoms of the infection. Get help right away if:  You have trouble breathing.  You have pain or pressure in your chest.  You have confusion.  You have bluish lips and fingernails.  You have difficulty waking from sleep.  You have symptoms that get worse. These symptoms may represent a serious problem that is an emergency. Do not wait to see if the symptoms will go away. Get medical help right away. Call your local emergency services (911 in the [place] Do not drive yourself to the hospital. Let the emergency medical personnel know if you think you have  COVID-19. Summary  COVID-19 is a respiratory infection that is caused by a virus. It is also known as coronavirus disease or novel coronavirus. It can cause serious infections, such as pneumonia, acute respiratory distress syndrome, acute respiratory failure, or sepsis.  The virus that causes COVID-19 is contagious. This means that it can spread from person to person through droplets from breathing, speaking, singing, coughing, or sneezing.  You are more likely to develop a serious illness if you are 50 years of age or older, have a weak immune system, live in a nursing home, or have chronic disease.  There is no medicine to treat COVID-19. Your health care provider will talk with you about ways to treat your symptoms.    Take steps to protect yourself and others from infection. Wash your hands often and disinfect objects and surfaces that are frequently touched every day. Stay away from people who are sick and wear a mask if you are sick. This information is not intended to replace advice given to you by your health care provider. Make sure you discuss any questions you have with your health care provider. Document Revised: 02/06/2019 Document Reviewed: 05/15/2018 Elsevier Patient Education  2020 [employer] What types of side effects do monoclonal antibody drugs cause?  Common side effects  In general, the more common side effects caused by monoclonal antibody drugs include: . Allergic reactions, such as hives or itching . Flu-like signs and symptoms, including chills, fatigue, fever, and muscle aches and pains . Nausea, vomiting . Diarrhea . Skin rashes . Low blood pressure   The CDC is recommending patients who receive monoclonal antibody treatments wait at least 90 days before being vaccinated.  Currently, there are no data on the safety and efficacy of mRNA COVID-19 vaccines in persons who received monoclonal antibodies or convalescent plasma as part of COVID-19 treatment. Based  on the estimated half-life of such therapies as well as evidence suggesting that reinfection is uncommon in the 90 days after initial infection, vaccination should be deferred for at least 90 days, as a precautionary measure until additional information becomes available, to avoid interference of the antibody treatment with vaccine-induced immune responses. 

## 2019-08-10 ENCOUNTER — Other Ambulatory Visit (HOSPITAL_COMMUNITY): Payer: Self-pay

## 2020-02-10 ENCOUNTER — Other Ambulatory Visit: Payer: Self-pay | Admitting: Internal Medicine

## 2020-02-10 DIAGNOSIS — R42 Dizziness and giddiness: Secondary | ICD-10-CM

## 2020-02-23 ENCOUNTER — Other Ambulatory Visit: Payer: Self-pay

## 2020-02-23 ENCOUNTER — Ambulatory Visit
Admission: RE | Admit: 2020-02-23 | Discharge: 2020-02-23 | Disposition: A | Discharge status: Home / Self Care | Payer: Medicare Other | Source: Ambulatory Visit | Attending: Internal Medicine | Admitting: Internal Medicine

## 2020-02-23 DIAGNOSIS — R42 Dizziness and giddiness: Secondary | ICD-10-CM

## 2020-02-24 ENCOUNTER — Other Ambulatory Visit: Payer: Self-pay | Admitting: Otolaryngology

## 2020-02-24 DIAGNOSIS — E041 Nontoxic single thyroid nodule: Secondary | ICD-10-CM

## 2020-03-02 ENCOUNTER — Ambulatory Visit
Admission: RE | Admit: 2020-03-02 | Discharge: 2020-03-02 | Disposition: A | Discharge status: Home / Self Care | Payer: Medicare Other | Source: Ambulatory Visit | Attending: Otolaryngology | Admitting: Otolaryngology

## 2020-03-02 ENCOUNTER — Other Ambulatory Visit: Payer: Self-pay

## 2020-03-02 DIAGNOSIS — E041 Nontoxic single thyroid nodule: Secondary | ICD-10-CM | POA: Insufficient documentation

## 2020-03-04 ENCOUNTER — Other Ambulatory Visit: Payer: Self-pay | Admitting: Otolaryngology

## 2020-03-04 ENCOUNTER — Other Ambulatory Visit (HOSPITAL_COMMUNITY): Payer: Self-pay | Admitting: Otolaryngology

## 2020-03-04 DIAGNOSIS — E041 Nontoxic single thyroid nodule: Secondary | ICD-10-CM

## 2020-03-07 ENCOUNTER — Other Ambulatory Visit: Payer: Self-pay | Admitting: Otolaryngology

## 2020-03-07 DIAGNOSIS — E041 Nontoxic single thyroid nodule: Secondary | ICD-10-CM

## 2020-03-09 NOTE — Discharge Instructions (Signed)
Thyroid Needle Biopsy, Care After This sheet gives you information about how to care for yourself after your procedure. Your health care provider may also give you more specific instructions. If you have problems or questions, contact your health care provider. What can I expect after the procedure? After the procedure, it is common to have:  Soreness and tenderness that lasts for a few days.  Bruising where the needle was inserted (puncture site). Follow these instructions at home:   Take over-the-counter and prescription medicines only as told by your health care provider.  To help ease discomfort, keep your head raised (elevated) when you are lying down. When you move from lying down to sitting up, use both hands to support the back of your head and neck.  Check your puncture site every day for signs of infection. Check for: ? Redness, swelling, or pain. ? Fluid or blood. ? Warmth. ? Pus or a bad smell.  Return to your normal activities as told by your health care provider. Ask your health care provider what activities are safe for you.  Keep all follow-up visits as told by your health care provider. This is important. Contact a health care provider if:  You have redness, swelling, or pain around your puncture site.  You have fluid or blood coming from your puncture site.  Your puncture site feels warm to the touch.  You have pus or a bad smell coming from your puncture site.  You have a fever. Get help right away if:  You have severe bleeding from the puncture site.  You have difficulty swallowing.  You have swollen glands (lymph nodes) in your neck. Summary  It is common to have some bruising and soreness where the needle was inserted in your lower front neck area (puncture site).  Check your puncture site every day for signs of infection, such as redness, swelling, or pain.  Get help right away if you have severe bleeding from your puncture site. This  information is not intended to replace advice given to you by your health care provider. Make sure you discuss any questions you have with your health care provider. Document Revised: 03/22/2017 Document Reviewed: 01/21/2017 Elsevier Patient Education  2020 [employer]  

## 2020-03-10 ENCOUNTER — Other Ambulatory Visit: Payer: Self-pay

## 2020-03-10 ENCOUNTER — Ambulatory Visit
Admission: RE | Admit: 2020-03-10 | Discharge: 2020-03-10 | Disposition: A | Discharge status: Home / Self Care | Payer: Medicare Other | Source: Ambulatory Visit | Attending: Otolaryngology | Admitting: Otolaryngology

## 2020-03-10 DIAGNOSIS — E041 Nontoxic single thyroid nodule: Secondary | ICD-10-CM | POA: Insufficient documentation

## 2020-03-11 LAB — CYTOLOGY - NON PAP

## 2020-11-25 ENCOUNTER — Ambulatory Visit: Payer: Medicare Other | Attending: Unknown Physician Specialty

## 2020-11-25 ENCOUNTER — Other Ambulatory Visit: Payer: Self-pay

## 2020-11-25 DIAGNOSIS — R2681 Unsteadiness on feet: Secondary | ICD-10-CM | POA: Diagnosis present

## 2020-11-25 DIAGNOSIS — M6281 Muscle weakness (generalized): Secondary | ICD-10-CM | POA: Diagnosis present

## 2020-11-25 DIAGNOSIS — R278 Other lack of coordination: Secondary | ICD-10-CM | POA: Diagnosis present

## 2020-11-25 DIAGNOSIS — R2689 Other abnormalities of gait and mobility: Secondary | ICD-10-CM | POA: Insufficient documentation

## 2020-11-25 NOTE — Therapy (Signed)
[facility] MAIN [facility] SERVICES [address] [place], [place], [address] Phone: [phone]   Fax:  [phone]  Physical Therapy Evaluation  Patient Details  Name: Desiree Ramirez MRN: [id number] Date of Birth: [birth year] Referring Provider (PT): Linus Salmons, MD   Encounter Date: 11/25/2020   PT End of Session - 11/25/20 1328     Visit Number 1    Number of Visits 25    Date for PT Re-Evaluation 02/17/21    Authorization Type eval completed 11/25/2020    PT Start Time 1012    PT Stop Time 1114    PT Time Calculation (min) 62 min    Equipment Utilized During Treatment Gait belt    Activity Tolerance Patient tolerated treatment well    Behavior During Therapy [facility] for tasks assessed/performed             Past Medical History:  Diagnosis Date   Allergic rhinitis    with eczema   Anemia    Asthma    without status asthmaticus; with allergic component   Colon polyps    Depression    Diabetes mellitus without complication (HCC)    History of carpal tunnel syndrome    bilateral   History of chest pain    negative stress test 05/2004   Hyperlipidemia    Hypertension    Idiopathic peripheral neuropathy    Microscopic hematuria    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 Caryn Section   Osteoarthritis    with history of whiplash   Sleep apnea    AHI 43.6, CPAP of 11 initiated   Vitamin D deficiency     Past Surgical History:  Procedure Laterality Date   ABDOMINAL HYSTERECTOMY     TAH/BSO with uterine enlargement, fibroids   CARPAL TUNNEL RELEASE Bilateral    CATARACT EXTRACTION Bilateral    COLONOSCOPY  10/16/2005   adenomatous polyp   COLONOSCOPY WITH PROPOFOL N/A 05/27/2015   Procedure: COLONOSCOPY WITH PROPOFOL;  Surgeon: Scot Jun, MD;  Location: [facility] ENDOSCOPY;  Service: Endoscopy;  Laterality: N/A;   ESOPHAGOGASTRODUODENOSCOPY (EGD) WITH PROPOFOL  N/A 05/27/2015   Procedure: ESOPHAGOGASTRODUODENOSCOPY (EGD) WITH PROPOFOL;  Surgeon: Scot Jun, MD;  Location: [facility] ENDOSCOPY;  Service: Endoscopy;  Laterality: N/A;   INGUINAL HERNIA REPAIR Right 06/17/2015   Procedure: HERNIA REPAIR INGUINAL ADULT;  Surgeon: Nadeen Landau, MD;  Location: [facility] ORS;  Service: General;  Laterality: Right;    There were no vitals filed for this visit.    Subjective Assessment - 11/25/20 1011     Subjective Pt presents to PT ambulating without an AD. Pt reports her main problem is feelings of imbalance, not dizziness. She reports onset of imbalance started July 4th 2021, where she was preparing to go on vacation and noticed she was walking toward her L side. She says her balance with walking continued to worsen but has since "leveled out." She has fallen 2x in the last six months, denies hitting her head with falls. She feels most off balance with looking over her shoulder, bending/looking down, and sometimes with looking up. She feels she is still moving when she is walking and tries to stop and says, "I have to get my footing." She denies any spinning sensation, although she reports she did recently get treated at ENT for "crystals in my ear," a few months ago and that it helped.  She has also recently had hearing tested and reports she is unable to hear higher pitches. She denies aural fullness, drainage, but does report ringing in her ears. She denies any feelings of light-headedness. Pt reports no headaches or sensitivity to light. She reports sometimes she does feel nauseated but that she has "stomach problems" and recently had diverticulitis. Pt has history of UTIs and was recently treated for one "a week ago," but reports no improvements in her balance/unsteadiness.    Pertinent History Pt presents to PT ambulating without an AD. Pt reports her main problem is feelings of imbalance, not dizziness. She reports onset of imbalance started July 4th 2021, where  she was preparing to go on vacation and noticed she was walking toward her L side. She says her balance with walking continued to worsen but has since "leveled out." She has fallen 2x in the last six months, denies hitting her head with falls. She feels most off balance with looking over her shoulder, bending/looking down, and sometimes with looking up. She feels she is still moving when she is walking and tries to stop and says, "I have to get my footing." She denies any spinning sensation, although she reports she did recently get treated at ENT for "crystals in my ear," a few months ago and that it helped. She has also recently had hearing tested and reports she is unable to hear higher pitches. She denies aural fullness, drainage, but does report ringing in her ears. She denies any feelings of light-headedness. Pt reports no headaches or sensitivity to light. She reports sometimes she does feel nauseated but that she has "stomach problems" and recently had diverticulitis. Pt has history of UTIs and was recently treated for one "a week ago," but reports no improvements in her balance/unsteadiness. Other PMH per chart includes anemia, ashtma, DM, B carpal tunnel syndrome, hx of chest pain (negative stress test 2006), hx stomach ulcers, HLD, HTN, idiopathic peripheral neuropathy, migraine, mood disorder, osteoarthritis w history of whiplash, sleep apnea, vitamin d deficiency, depression.    Limitations House hold activities;Walking    How long can you sit comfortably? unlimited    How long can you stand comfortably? unlimited    How long can you walk comfortably? intermittently affected by imbalance/unsteadiness    Diagnostic tests Pt recently had VNG testing completed where per chart pt had no nystagmus or subjective dizziness with Gilberto Better, no spontaneous nystagmus, pt noted to have abnormal smooth pursuits and saccades, abnormal optokinetic testing, no post headshake nystagmus, normal caloric testing  and normal positional testing. Pt also had hearing testing completed where per chart pt with sensorineural hearing loss.    Patient Stated Goals I just want to feel more secure when I'm doing anything.    Currently in Pain? No/denies                [facility] PT Assessment - 11/25/20 1028       Assessment   Medical Diagnosis dizziness, imbalance    Referring Provider (PT) Linus Salmons, MD    Onset Date/Surgical Date --   a little over a year ago   Hand Dominance Right    Next MD Visit has neurology visit coming up but unsure when    Prior Therapy none      Precautions   Precautions Fall      Restrictions   Weight Bearing Restrictions No      Balance Screen   Has the patient fallen in the past 6  months Yes    How many times? 2    Has the patient had a decrease in activity level because of a fear of falling?  No    Is the patient reluctant to leave their home because of a fear of falling?  Yes      Home Environment   Living Environment Private residence    Living Arrangements Spouse/significant other    Available Help at Discharge Family    Type of Home House    Home Access Stairs to enter    Entrance Stairs-Number of Steps 3    Entrance Stairs-Rails None   not currently, but getting one installed   Home Layout One level;Laundry or work area in basement   2 steps down to [employer], no Chief Financial Officer - single point      Prior Function   Level of Independence Independent    Vocation Retired    Leisure Pt likes to walk her dogs, gardening      Cognition   Overall Cognitive Status Within Functional Limits for tasks assessed      Standardized Balance Assessment   Standardized Balance Assessment Dynamic Gait Index      Dynamic Gait Index   Level Surface Normal    Change in Gait Speed Normal    Gait with Horizontal Head Turns Normal    Gait with Vertical Head Turns Mild Impairment    Gait and Pivot Turn Normal    Step Over Obstacle Mild Impairment     Step Around Obstacles Normal    Steps Mild Impairment    Total Score 21             VESTIBULAR AND BALANCE EVALUATION   HISTORY:  Subjective history of current problem: imbalance/unsteadiness with walking, bending, looking up and looking over her shoulder (see subjective for details) Description of dizziness: (vertigo, unsteadiness, lightheadedness, falling, general unsteadiness, whoozy, swimmy-headed sensation, aural fullness): general unsteadiness, but does report hx of "crystals" in her ears recently treated a few months ago at ENT appointment  Frequency: Pt states, "all the time." Duration: Pt reports onset with positional change, walking. Pt says as soon as she stops walking feelings of imbalance take a few seconds to resolve.  Symptom nature: (motion provoked, positional, spontaneous, constant, variable, intermittent), positional and motion provoked, intermittent  Provocative Factors: positional changes, walking Easing Factors: Sitting/rest  Progression of symptoms: (better, worse, no change since onset): Pt reports initially worsened but has since "leveled out." History of similar episodes: Pt reports hx of BPPV, but denies spinning sensation at this time, current complaints of imbalance since July 4th 2021.  Falls (yes/no): Yes Number of falls in past 6 months: 2  Prior Functional Level: No difficulty with walking until imbalance started gradually  Auditory complaints (tinnitus, pain, drainage, hearing loss, aural fullness): Reports ringing in ears, hearing loss (see subjective), denies pain or aural fullness, denies drainage.  Vision (diplopia, visual field loss, recent changes, last eye exam): Reports no changes  Red Flags: (dysarthria, dysphagia, drop attacks, bowel and bladder changes, recent weight loss/gain) Review of systems negative for red flags.  Pt does report diarrhea every so often but has been to the gastroenterologist. She was found to have diverticulitis,  polyps. Denies other red flag sx.     EXAMINATION  POSTURE: rounded shoulders, slight forward head posture, slight thoracic kyphosis  NEUROLOGICAL SCREEN:  SOMATOSENSORY:         Sensation  Intact      Diminished         Absent  Light touch In BUES/BLEs      COORDINATION: Finger to Nose: Normal Heel to Shin: Normal (some difficulty following cues to perform with correct technique) Rapid Alternating Movements: Normal   MUSCULOSKELETAL SCREEN:  Cervical Spine ROM: Impaired cervical rotation and lateral flexion B, pt also reports some pain in R shoulder with cervical rotation to R.    MMT: BLE strength grossly 4/5, greatest weakness in hip musculature   Gait: Formal assessment to be completed   POSTURAL CONTROL TESTS:   Clinical Test of Sensory Interaction for Balance    (CTSIB):  CONDITION TIME STRATEGY SWAY  Eyes open, firm surface 30 seconds ankle   Eyes closed, firm surface 30 seconds ankle Mild sway  Eyes open, foam surface 30 seconds ankle Increased sway  Eyes closed, foam surface 30 seconds ankle Unable, loses balance, requires UE support and min a from PT to regain balance    OCULOMOTOR / VESTIBULAR TESTING:  Oculomotor Exam- Room Light    Findings Comments  Ocular Alignment normal   Ocular ROM normal   Spontaneous Nystagmus not examined   Gaze-Holding Nystagmus not examined   End-Gaze Nystagmus normal   Vergence (normal 2-3") normal   Smooth Pursuit abnormal Saccadic   Cross-Cover Test not examined   Saccades normal   VOR Cancellation not examined   Left Head Impulse abnormal Undershooting, corrective saccade  Right Head Impulse abnormal Undershooting, corrective saccade  Static Acuity Not examined   Dynamic Acuity not examined     BPPV TESTS: DEFERRED d/t time  Symptoms Duration Intensity Nystagmus  L Dix-Hallpike      R Dix-Hallpike      L Head Roll      R Head Roll      L Sidelying Test      R Sidelying Test         FUNCTIONAL OUTCOME MEASURES   Results Comments  BERG deferred Fall risk, in need of intervention  DGI 21/24   ABC Scale 66.25% Indicates impairment  DHI 28/100 Indicates impairment   FOTO: 59, risk adjusted is 53 (predicated for d/c 60)  SLB: able to maintain 1-3 sec BLEs before LOB requiring UE support and min a from PT to regain balance.  ASSESSMENT Clinical Impression: Pt is a pleasant 70 year-old female referred for dizziness and imbalance with a hx of falls. PT examination reveals deficits in cervical ROM, balance, balance confidence, and BLE strength. Pt DHI indicates handicap d/t imbalance sx. FOTO score indicates decreased QOL and functional mobility. Pt performance on M-CTSIB indicates poor use of vestibular cues to maintain balance. Oculomotor testing reveals abnormal smooth pursuits and abnormal B head impulse testing. While pt denies spinning sensation, Dix-Hallpike and Roll testing should be completed next appointment as pt reports being treated for BPPV a few months ago. Pt will benefit from skilled PT services to address the aforementioned deficits in order to decrease fall risk and improve QOL.  PLAN Next Visit: Gilberto Better, Roll testing, initiate interventions HEP: initiate        Objective measurements completed on examination: See above findings.       PT Education - 11/25/20 1327     Education Details Pt educated on examination findings, indications for PT/POC    Person(s) Educated Patient    Methods Explanation    Comprehension Verbalized understanding  PT Short Term Goals - 11/25/20 1036       PT SHORT TERM GOAL #1   Title Patient will be independent in home exercise program to improve strength/mobility and balance for increased safety with ADLs.    Baseline 8/5: to be initiated    Time 6    Period Weeks    Status New    Target Date 01/06/21               PT Long Term Goals - 11/25/20 1037       PT LONG TERM GOAL  #1   Title Patient will reduce dizziness handicap inventory score by at least 13 points for less dizziness with ADLs and increased safety with home and work tasks.    Baseline 8/5: 28%    Time 12    Period Weeks    Status New    Target Date 02/17/21      PT LONG TERM GOAL #2   Title Patient will increase ABC scale score >80% to demonstrate better functional mobility and better confidence with ADLs.    Baseline 8/5: 66.25%    Time 12    Period Weeks    Status New    Target Date 02/17/21      PT LONG TERM GOAL #3   Title Patient will increase FOTO score to equal to or greater than 70 to demonstrate improvement in mobility and quality of life.    Baseline 8/5: 59    Time 12    Period Weeks    Status New    Target Date 02/17/21      PT LONG TERM GOAL #4   Title Patient will increase Berg Balance score by > 6 points to demonstrate decreased fall risk during functional activities.    Baseline 8/5: to be completed future session    Time 12    Period Weeks    Status New    Target Date 02/17/21      PT LONG TERM GOAL #5   Title Patient will tolerate 5 seconds of single leg stance without loss of balance to improve ability to get in and out of shower safely.    Baseline 8/5: Pt maintains 1-3 sec of SLB BLEs, reports she has fallen getting in/out of shower    Time 12    Period Weeks    Status New    Target Date 02/17/21                    Plan - 11/25/20 1039     Clinical Impression Statement Pt is a pleasant 70 year-old female referred for dizziness and imbalance with a hx of falls. PT examination reveals deficits in cervical ROM, balance, balance confidence, and BLE strength. Pt DHI indicates handicap d/t imbalance sx. FOTO score indicates decreased QOL and functional mobility. Pt performance on M-CTSIB indicates poor use of vestibular cues to maintain balance. Oculomotor testing reveals abnormal smooth pursuits and abnormal B head impulse testing. While pt denies  spinning sensation, Dix-Hallpike and Roll testing should be completed next appointment as pt reports being treated for BPPV a few months ago. Pt will benefit from skilled PT services to address the aforementioned deficits in order to decrease fall risk and improve QOL.    Personal Factors and Comorbidities Age;Time since onset of injury/illness/exacerbation;Sex;Comorbidity 1;Comorbidity 3+;Comorbidity 2    Comorbidities PMH: Anemia, ashtma, DM, B carpal tunnel syndrome, hx of chest pain (negative stress test 2006), hx stomach ulcers, HLD,  HTN, idiopathic peripheral neuropathy, migraine, mood disorder, osteoarthritis w history of whiplash, sleep apnea, vitamin d deficiency, depression, reported history of BPPV    Examination-Activity Limitations Bathing;Bend;Dressing;Stairs;Locomotion Level    Examination-Participation Restrictions Community Activity;Laundry;Yard Work;Cleaning;Driving;Meal Prep;Shop    Stability/Clinical Decision Making Evolving/Moderate complexity    Clinical Decision Making Moderate    Rehab Potential Good    PT Frequency 2x / week    PT Duration 12 weeks    PT Treatment/Interventions ADLs/Self Care Home Management;Canalith Repostioning;Cryotherapy;Electrical Stimulation;Moist Heat;Ultrasound;DME Instruction;Gait training;Stair training;Functional mobility training;Therapeutic activities;Therapeutic exercise;Balance training;Neuromuscular re-education;Patient/family education;Manual techniques;Orthotic Fit/Training;Passive range of motion;Dry needling;Energy conservation;Splinting;Taping;Vestibular;Visual/perceptual remediation/compensation;Joint Manipulations    PT Next Visit Plan Dix-Hallpike, Roll Testing, initiate interventions, HEP    PT Home Exercise Plan to be initiated next visit    Consulted and Agree with Plan of Care Patient             Patient will benefit from skilled therapeutic intervention in order to improve the following deficits and impairments:  Abnormal  gait, Decreased activity tolerance, Decreased range of motion, Decreased knowledge of use of DME, Decreased strength, Dizziness, Improper body mechanics, Decreased balance, Decreased mobility, Difficulty walking, Increased muscle spasms, Impaired flexibility, Postural dysfunction, Hypomobility  Visit Diagnosis: Unsteadiness on feet  Muscle weakness (generalized)  Other abnormalities of gait and mobility     Problem List There are no problems to display for this patient.  Temple Pacini PT, DPT 11/25/2020, 2:04 PM  [place] [facility] MAIN [facility] SERVICES [address] [place], [place], [address] Phone: [phone]   Fax:  [phone]  Name: CARLING LIBERMAN MRN: [id number] Date of Birth: 1950/12/26

## 2020-11-25 NOTE — Therapy (Deleted)
[facility] MAIN [facility] SERVICES [address] [place], [place], [address] Phone: [phone]   Fax:  [phone]  Physical Therapy Evaluation  Patient Details  Name: Desiree Ramirez MRN: [id number] Date of Birth: [birth year] No data recorded  Encounter Date: 11/25/2020    Past Medical History:  Diagnosis Date   Allergic rhinitis    with eczema   Anemia    Asthma    without status asthmaticus; with allergic component   Colon polyps    Depression    Diabetes mellitus without complication (HCC)    History of carpal tunnel syndrome    bilateral   History of chest pain    negative stress test 05/2004   Hyperlipidemia    Hypertension    Idiopathic peripheral neuropathy    Microscopic hematuria    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 Caryn Section   Osteoarthritis    with history of whiplash   Sleep apnea    AHI 43.6, CPAP of 11 initiated   Vitamin D deficiency     Past Surgical History:  Procedure Laterality Date   ABDOMINAL HYSTERECTOMY     TAH/BSO with uterine enlargement, fibroids   CARPAL TUNNEL RELEASE Bilateral    CATARACT EXTRACTION Bilateral    COLONOSCOPY  10/16/2005   adenomatous polyp   COLONOSCOPY WITH PROPOFOL N/A 05/27/2015   Procedure: COLONOSCOPY WITH PROPOFOL;  Surgeon: Scot Jun, MD;  Location: [facility] ENDOSCOPY;  Service: Endoscopy;  Laterality: N/A;   ESOPHAGOGASTRODUODENOSCOPY (EGD) WITH PROPOFOL N/A 05/27/2015   Procedure: ESOPHAGOGASTRODUODENOSCOPY (EGD) WITH PROPOFOL;  Surgeon: Scot Jun, MD;  Location: [facility] ENDOSCOPY;  Service: Endoscopy;  Laterality: N/A;   INGUINAL HERNIA REPAIR Right 06/17/2015   Procedure: HERNIA REPAIR INGUINAL ADULT;  Surgeon: Nadeen Landau, MD;  Location: [facility] ORS;  Service: General;  Laterality: Right;    There were no vitals filed for this visit.    VESTIBULAR AND BALANCE EVALUATION   HISTORY:   Subjective history of current problem: Description of dizziness: (vertigo, unsteadiness, lightheadedness, falling, general unsteadiness, whoozy, swimmy-headed sensation, aural fullness): unsteadiness Frequency: all the time Duration: As soon as she stops walking takes a few seconds to settle Symptom nature: (motion provoked, positional, spontaneous, constant, variable, intermittent), positional and motion provoked  Provocative Factors: position, walking Easing Factors: rest  Progression of symptoms: (better, worse, no change since onset) History of similar episodes:  Falls (yes/no): Number of falls in past 6 months:   Prior Functional Level: No difficulty with walking until it started gradually  Auditory complaints (tinnitus, pain, drainage, hearing loss, aural fullness): Vision (diplopia, visual field loss, recent changes, last eye exam): no changes  Red Flags: (dysarthria, dysphagia, drop attacks, bowel and bladder changes, recent weight loss/gain) Review of systems negative for red flags.  Pt does report diarrhea every so often but has been to the gastroenterologist. She was found to have diverticulitis, polyps.     EXAMINATION  POSTURE: rounded shoulders, slight forward head posture, slight thoracic kyphosis  NEUROLOGICAL SCREEN: (2+ unless otherwise noted.) N=normal  Ab=abnormal  Cranial Nerves Visual acuity and visual fields are intact  Extraocular muscles are intact  Facial sensation is intact bilaterally  Facial strength is intact bilaterally  Hearing is normal as tested by gross conversation Palate elevates midline, normal phonation  Shoulder shrug strength is intact  Tongue protrudes midline    SOMATOSENSORY:  Sensation           Intact      Diminished         Absent  Light touch       COORDINATION: Finger to Nose: Normal Heel to Shin: Normal Rapid Alternating Movements: Normal   MUSCULOSKELETAL SCREEN: Cervical Spine ROM: Impaired rotation and  lateral flexion, pt also reports some pain in R shoulder    ROM:   MMT: Gross BLE strength is 4/5, greatest weakness in hip musculature   Functional Mobility:  Gait: Scanning of visual environment with gait is:    POSTURAL CONTROL TESTS:   Clinical Test of Sensory Interaction for Balance    (CTSIB):  CONDITION TIME STRATEGY SWAY  Eyes open, firm surface 30 seconds ankle   Eyes closed, firm surface 30 seconds ankle yes  Eyes open, foam surface 30 seconds ankle yes  Eyes closed, foam surface 30 seconds ankle unable    OCULOMOTOR / VESTIBULAR TESTING:  Oculomotor Exam- Room Light Vergence WNL Saccades normal Smooth pursuits abnormal     Findings Comments  Ocular Alignment {normal/abnormal/not examined:14677}   Ocular ROM {normal/abnormal/not examined:14677}   Spontaneous Nystagmus {normal/abnormal/not examined:14677}   Gaze-Holding Nystagmus {normal/abnormal/not examined:14677}   End-Gaze Nystagmus {normal/abnormal/not examined:14677}   Vergence (normal 2-3") {normal/abnormal/not examined:14677}   Smooth Pursuit {normal/abnormal/not examined:14677}   Cross-Cover Test {normal/abnormal/not examined:14677}   Saccades {normal/abnormal/not examined:14677}   VOR Cancellation {normal/abnormal/not examined:14677}   Left Head Impulse abnormal   Right Head Impulse abnormal   Static Acuity {normal/abnormal/not examined:14677}   Dynamic Acuity {normal/abnormal/not examined:14677}     Oculomotor Exam- Fixation Suppressed  Findings Comments  Ocular Alignment {normal/abnormal/not examined:14677}   Spontaneous Nystagmus {normal/abnormal/not examined:14677}   Gaze-Holding Nystagmus {normal/abnormal/not examined:14677}   End-Gaze Nystagmus {normal/abnormal/not examined:14677}   Head Shaking Nystagmus {normal/abnormal/not examined:14677}   Pressure-Induced Nystagmus {normal/abnormal/not examined:14677}   Hyperventilation Induced Nystagmus {normal/abnormal/not examined:14677}    Skull Vibration Induced Nystagmus {normal/abnormal/not examined:14677}     BPPV TESTS:  Symptoms Duration Intensity Nystagmus  L Dix-Hallpike None   None  R Dix-Hallpike None   None  L Head Roll None   None  R Head Roll None   None  L Sidelying Test      R Sidelying Test        FUNCTIONAL OUTCOME MEASURES   Results Comments  BERG deferred Fall risk, in need of intervention  DGI 21/24   TUG seconds   5TSTS seconds   10 Meter Gait Speed Self-selected: s = m/s; Fastest: s = m/s Below normative values for full community ambulation  ABC Scale %   DHI /100    FOTO: 59, risk adjusted is 53 (goal for d/c 60)  SLB: able to maintain 2-3 sec BLEs   ASSESSMENT Clinical Impression: Pt is a pleasant year-old female/female referred for difficulty with baalance. PT examination reveals deficits . Pt presents with deficits in strength, gait and balance. Pt will benefit from skilled PT services to address deficits in balance and decrease risk for future falls.   Low (stable): no personal factors/comorbidities, 1-2 body systems/activity limitations/participation restrictions   Moderate (evolving): 1-2 personal factors/comorbidities, 3 or more body systems/activity limitations/participation restrictions   High (unstable): 3 or more personal factors/comorbidities, 4 or more body systems/activity limitations/participation restrictions    PLAN Next Visit: HEP:    Pt will be independent with HEP in order to improve strength and balance in order to decrease fall risk and improve function at home and work.   Pt will improve BERG  by at least 3 points in order to demonstrate clinically significant improvement in balance.    Pt will improve DGI by at least 3 points in order to demonstrate clinically significant improvement in balance and decreased risk for falls.  Pt will improve ABC by at least 13% in order to demonstrate clinically significant improvement in balance confidence.   Pt will  decrease 5TSTS by at least 3 seconds in order to demonstrate clinically significant improvement in LE strength.  Pt will decrease TUG to below 14 seconds/decrease in order to demonstrate decreased fall risk.  Pt will decrease DHI score by at least 18 points in order to demonstrate clinically significant reduction in disability   Pt will increase by at least 74m (12ft) in order to demonstrate clinically significant improvement in cardiopulmonary endurance and community ambulation                              Objective measurements completed on examination: See above findings.                             Patient will benefit from skilled therapeutic intervention in order to improve the following deficits and impairments:     Visit Diagnosis: No diagnosis found.     Problem List There are no problems to display for this patient.   Baird Kay 11/25/2020, 10:11 AM  [facility] MAIN [facility] SERVICES [address] [place], [place], [address] Phone: [phone]   Fax:  [phone]  Name: Desiree Ramirez MRN: [id number] Date of Birth: 11-04-1950

## 2020-11-28 ENCOUNTER — Ambulatory Visit: Payer: Medicare Other

## 2020-11-30 ENCOUNTER — Other Ambulatory Visit: Payer: Self-pay

## 2020-11-30 ENCOUNTER — Ambulatory Visit: Payer: Medicare Other

## 2020-11-30 DIAGNOSIS — R2681 Unsteadiness on feet: Secondary | ICD-10-CM

## 2020-11-30 DIAGNOSIS — R2689 Other abnormalities of gait and mobility: Secondary | ICD-10-CM

## 2020-11-30 DIAGNOSIS — R278 Other lack of coordination: Secondary | ICD-10-CM

## 2020-11-30 NOTE — Therapy (Signed)
[facility] MAIN [facility] [address] [place], [place], [address] Phone: [phone]   Fax:  [phone]  Physical Therapy Treatment  Patient Details  Name: Desiree Ramirez MRN: [id number] Date of Birth: 26-Nov-1950 Referring Provider (PT): Linus SalmonsMcQueen, Chapman, MD   Encounter Date: 11/30/2020   PT End of Session - 11/30/20 1715     Visit Number 2    Number of Visits 25    Date for PT Re-Evaluation 02/17/21    Authorization Type eval completed 11/25/2020    PT Start Time 1434    PT Stop Time 1515    PT Time Calculation (min) 41 min    Equipment Utilized During Treatment Gait belt    Activity Tolerance Patient tolerated treatment well    Behavior During Therapy [facility] for tasks assessed/performed             Past Medical History:  Diagnosis Date   Allergic rhinitis    with eczema   Anemia    Asthma    without status asthmaticus; with allergic component   Colon polyps    Depression    Diabetes mellitus without complication (HCC)    History of carpal tunnel syndrome    bilateral   History of chest pain    negative stress test 05/2004   Hyperlipidemia    Hypertension    Idiopathic peripheral neuropathy    Microscopic hematuria    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 Caryn SectionAarti Kapur   Osteoarthritis    with history of whiplash   Sleep apnea    AHI 43.6, CPAP of 11 initiated   Vitamin D deficiency     Past Surgical History:  Procedure Laterality Date   ABDOMINAL HYSTERECTOMY     TAH/BSO with uterine enlargement, fibroids   CARPAL TUNNEL RELEASE Bilateral    CATARACT EXTRACTION Bilateral    COLONOSCOPY  10/16/2005   adenomatous polyp   COLONOSCOPY WITH PROPOFOL N/A 05/27/2015   Procedure: COLONOSCOPY WITH PROPOFOL;  Surgeon: Scot Junobert T Elliott, MD;  [facility];  Service: Endoscopy;  Laterality: N/A;   ESOPHAGOGASTRODUODENOSCOPY (EGD) WITH PROPOFOL  N/A 05/27/2015   Procedure: ESOPHAGOGASTRODUODENOSCOPY (EGD) WITH PROPOFOL;  Surgeon: Scot Junobert T Elliott, MD;  [facility];  Service: Endoscopy;  Laterality: N/A;   INGUINAL HERNIA REPAIR Right 06/17/2015   Procedure: HERNIA REPAIR INGUINAL ADULT;  Surgeon: Nadeen LandauJarvis Wilton Smith, MD;  Location: [facility] ORS;  Service: General;  Laterality: Right;    There were no vitals filed for this visit.   Subjective Assessment - 11/30/20 1714     Subjective Pt reports no falls since prior session. [name] present at session with pt.    Pertinent History Pt presents to PT ambulating without an AD. Pt reports her main problem is feelings of imbalance, not dizziness. She reports onset of imbalance started July 4th 2021, where she was preparing to go on vacation and noticed she was walking toward her L side. She says her balance with walking continued to worsen but has since "leveled out." She has fallen 2x in the last six months, denies hitting her head with falls. She feels most off balance with looking over her shoulder, bending/looking down, and sometimes with looking up. She feels she is still moving when she is walking and tries to stop and says, "I have to get my footing." She denies any spinning sensation, although she reports she did recently  get treated at ENT for "crystals in my ear," a few months ago and that it helped. She has also recently had hearing tested and reports she is unable to hear higher pitches. She denies aural fullness, drainage, but does report ringing in her ears. She denies any feelings of light-headedness. Pt reports no headaches or sensitivity to light. She reports sometimes she does feel nauseated but that she has "stomach problems" and recently had diverticulitis. Pt has history of UTIs and was recently treated for one "a week ago," but reports no improvements in her balance/unsteadiness. Other PMH per chart includes anemia, ashtma, DM, B carpal tunnel syndrome, hx of chest pain  (negative stress test 2006), hx stomach ulcers, HLD, HTN, idiopathic peripheral neuropathy, migraine, mood disorder, osteoarthritis w history of whiplash, sleep apnea, vitamin d deficiency, depression.    Limitations House hold activities;Walking    How long can you sit comfortably? unlimited    How long can you stand comfortably? unlimited    How long can you walk comfortably? intermittently affected by imbalance/unsteadiness    Diagnostic tests Pt recently had VNG testing completed where per chart pt had no nystagmus or subjective dizziness with Gilberto Better, no spontaneous nystagmus, pt noted to have abnormal smooth pursuits and saccades, abnormal optokinetic testing, no post headshake nystagmus, normal caloric testing and normal positional testing. Pt also had hearing testing completed where per chart pt with sensorineural hearing loss.    Patient Stated Goals I just want to feel more secure when I'm doing anything.    Currently in Pain? No/denies            TREATMENT  Neuro Re-Ed:  Dix-Hallpike testing: negative B Roll testing: negative B  PT instructs pt through seated VORx1 exercises with horizontal and vertical head turns: Pt performs-  Seated vorx1 with horizontal head turns, plain background- 6x30 sec. Frequent use of cues for technique. No reports of sx. Seated vorx1 with vertical head turns, plain background - 3x30 sec. Pt demonstrates corrective saccades. Rates more difficult than with horizontal head turns.   PT issues HEP handout of seated VORx1 exercise with vertical head turns, to be performed every day for a total of 3 minutes, carried out in 30 second bouts. Pt verbalized understanding.  At support surface- Tandem stance - 2x30 sec BLEs. Pt rates difficult. Uses frequent UE support.  Cone taps (SLB progression) - x multiple reps each LE. Very challenging for pt.  TherEx- PT instructs pt through stretching exercises to address impaired cervical mobility, as this  could be impacting pt's sx of imbalance.   Seated: Upper trap stretch - 2x30 sec B Levator scap stretch - 2x30 sec B. Instructed to perform within pain-free range.   Pt educated throughout session about proper posture and technique with exercises. Improved exercise technique, movement at target joints, use of target muscles after min to mod verbal, visual, tactile cues.    PT Education - 11/30/20 1715     Education Details HEP (see note)    Person(s) Educated Patient    Methods Explanation;Demonstration;Tactile cues;Verbal cues;Handout    Comprehension Verbalized understanding;Need further instruction;Returned demonstration              PT Short Term Goals - 11/25/20 1036       PT SHORT TERM GOAL #1   Title Patient will be independent in home exercise program to improve strength/mobility and balance for increased safety with ADLs.    Baseline 8/5: to be initiated    Time 6  Period Weeks    Status New    Target Date 01/06/21               PT Long Term Goals - 11/25/20 1037       PT LONG TERM GOAL #1   Title Patient will reduce dizziness handicap inventory score by at least 13 points for less dizziness with ADLs and increased safety with home and work tasks.    Baseline 8/5: 28%    Time 12    Period Weeks    Status New    Target Date 02/17/21      PT LONG TERM GOAL #2   Title Patient will increase ABC scale score >80% to demonstrate better functional mobility and better confidence with ADLs.    Baseline 8/5: 66.25%    Time 12    Period Weeks    Status New    Target Date 02/17/21      PT LONG TERM GOAL #3   Title Patient will increase FOTO score to equal to or greater than 70 to demonstrate improvement in mobility and quality of life.    Baseline 8/5: 59    Time 12    Period Weeks    Status New    Target Date 02/17/21      PT LONG TERM GOAL #4   Title Patient will increase Berg Balance score by > 6 points to demonstrate decreased fall risk during  functional activities.    Baseline 8/5: to be completed future session    Time 12    Period Weeks    Status New    Target Date 02/17/21      PT LONG TERM GOAL #5   Title Patient will tolerate 5 seconds of single leg stance without loss of balance to improve ability to get in and out of shower safely.    Baseline 8/5: Pt maintains 1-3 sec of SLB BLEs, reports she has fallen getting in/out of shower    Time 12    Period Weeks    Status New    Target Date 02/17/21                   Plan - 11/30/20 1716     Clinical Impression Statement Gilberto Better and Roll testing performed on this date and were all negative. Remainder of session focused on addressing pt's impaired cervical ROM and instructing pt in VORx1 with vertical and horizontal head turns. Pt had most difficulty with vertical head turns, exhibiting corrective saccades. Pt also very challenged with tandem stance balance exercise. The pt will benefit from further skilled PT services to improve balance and ROM in order to improve QOL and decrease fall risk.    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    Examination-Activity Limitations Bathing;Bend;Dressing;Stairs;Locomotion Level    Examination-Participation Restrictions Community Activity;Laundry;Yard Work;Cleaning;Driving;Meal Prep;Shop    Stability/Clinical Decision Making Evolving/Moderate complexity    Rehab Potential Good    PT Frequency 2x / week    PT Duration 12 weeks    PT Treatment/Interventions ADLs/Self Care Home Management;Canalith Repostioning;Cryotherapy;Electrical  Stimulation;Moist Heat;Ultrasound;DME Instruction;Gait training;Stair training;Functional mobility training;Therapeutic activities;Therapeutic exercise;Balance training;Neuromuscular re-education;Patient/family education;Manual techniques;Orthotic Fit/Training;Passive range of motion;Dry needling;Energy  conservation;Splinting;Taping;Vestibular;Visual/perceptual remediation/compensation;Joint Manipulations    PT Next Visit Plan cervical mobility, stretching, and balance    PT Home Exercise Plan Seated VORx1 with vertical head turns, daily, in 30 second bouts for a total of 3 minutes    Consulted and Agree with Plan of Care Patient             Patient will benefit from skilled therapeutic intervention in order to improve the following deficits and impairments:  Abnormal gait, Decreased activity tolerance, Decreased range of motion, Decreased knowledge of use of DME, Decreased strength, Dizziness, Improper body mechanics, Decreased balance, Decreased mobility, Difficulty walking, Increased muscle spasms, Impaired flexibility, Postural dysfunction, Hypomobility  Visit Diagnosis: Unsteadiness on feet  Other abnormalities of gait and mobility  Other lack of coordination     Problem List There are no problems to display for this patient.  Temple Pacini PT, DPT 11/30/2020, 5:25 PM  [facility] MAIN [facility] SERVICES [address] [place], [place], [address] Phone: [phone]   Fax:  [phone]  Name: Desiree Ramirez MRN: [id number] Date of Birth: Oct 17, 1950

## 2020-12-05 ENCOUNTER — Ambulatory Visit: Payer: Medicare Other

## 2020-12-05 ENCOUNTER — Other Ambulatory Visit: Payer: Self-pay

## 2020-12-05 DIAGNOSIS — R278 Other lack of coordination: Secondary | ICD-10-CM

## 2020-12-05 DIAGNOSIS — R2681 Unsteadiness on feet: Secondary | ICD-10-CM | POA: Diagnosis not present

## 2020-12-05 DIAGNOSIS — M6281 Muscle weakness (generalized): Secondary | ICD-10-CM

## 2020-12-05 DIAGNOSIS — R2689 Other abnormalities of gait and mobility: Secondary | ICD-10-CM

## 2020-12-05 NOTE — Therapy (Signed)
[facility] MAIN [facility] SERVICES [address] [place], [place], [address] Phone: [phone]   Fax:  [phone]  Physical Therapy Treatment  Patient Details  Name: Desiree Ramirez MRN: [id number] Date of Birth: Feb 04, 1951 Referring Provider (PT): Linus Salmons, MD   Encounter Date: 12/05/2020   PT End of Session - 12/06/20 1409     Visit Number 3    Number of Visits 25    Date for PT Re-Evaluation 02/17/21    Authorization Type eval completed 11/25/2020    PT Start Time 1019    PT Stop Time 1059    PT Time Calculation (min) 40 min    Equipment Utilized During Treatment Gait belt    Activity Tolerance Patient tolerated treatment well    Behavior During Therapy [facility] for tasks assessed/performed             Past Medical History:  Diagnosis Date   Allergic rhinitis    with eczema   Anemia    Asthma    without status asthmaticus; with allergic component   Colon polyps    Depression    Diabetes mellitus without complication (HCC)    History of carpal tunnel syndrome    bilateral   History of chest pain    negative stress test 05/2004   Hyperlipidemia    Hypertension    Idiopathic peripheral neuropathy    Microscopic hematuria    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 Caryn Section   Osteoarthritis    with history of whiplash   Sleep apnea    AHI 43.6, CPAP of 11 initiated   Vitamin D deficiency     Past Surgical History:  Procedure Laterality Date   ABDOMINAL HYSTERECTOMY     TAH/BSO with uterine enlargement, fibroids   CARPAL TUNNEL RELEASE Bilateral    CATARACT EXTRACTION Bilateral    COLONOSCOPY  10/16/2005   adenomatous polyp   COLONOSCOPY WITH PROPOFOL N/A 05/27/2015   Procedure: COLONOSCOPY WITH PROPOFOL;  Surgeon: Scot Jun, MD;  Location: [facility] ENDOSCOPY;  Service: Endoscopy;  Laterality: N/A;   ESOPHAGOGASTRODUODENOSCOPY (EGD) WITH PROPOFOL  N/A 05/27/2015   Procedure: ESOPHAGOGASTRODUODENOSCOPY (EGD) WITH PROPOFOL;  Surgeon: Scot Jun, MD;  Location: [facility] ENDOSCOPY;  Service: Endoscopy;  Laterality: N/A;   INGUINAL HERNIA REPAIR Right 06/17/2015   Procedure: HERNIA REPAIR INGUINAL ADULT;  Surgeon: Nadeen Landau, MD;  Location: [facility] ORS;  Service: General;  Laterality: Right;    There were no vitals filed for this visit.    Subjective Assessment - 12/05/20 1020     Subjective Pt states her balance has "been pretty good." However, pt reports she almost fell when walking down to her basement. She thinks she lost her balance when going from one of the steps to the carpet.    Patient is accompained by: Family member    Pertinent History Pt presents to PT ambulating without an AD. Pt reports her main problem is feelings of imbalance, not dizziness. She reports onset of imbalance started July 4th 2021, where she was preparing to go on vacation and noticed she was walking toward her L side. She says her balance with walking continued to worsen but has since "leveled out." She has fallen 2x in the last six months, denies hitting her head with falls. She feels most off balance with looking over her shoulder, bending/looking down, and sometimes with looking up.  She feels she is still moving when she is walking and tries to stop and says, "I have to get my footing." She denies any spinning sensation, although she reports she did recently get treated at ENT for "crystals in my ear," a few months ago and that it helped. She has also recently had hearing tested and reports she is unable to hear higher pitches. She denies aural fullness, drainage, but does report ringing in her ears. She denies any feelings of light-headedness. Pt reports no headaches or sensitivity to light. She reports sometimes she does feel nauseated but that she has "stomach problems" and recently had diverticulitis. Pt has history of UTIs and was recently treated for one "a  week ago," but reports no improvements in her balance/unsteadiness. Other PMH per chart includes anemia, ashtma, DM, B carpal tunnel syndrome, hx of chest pain (negative stress test 2006), hx stomach ulcers, HLD, HTN, idiopathic peripheral neuropathy, migraine, mood disorder, osteoarthritis w history of whiplash, sleep apnea, vitamin d deficiency, depression.    Limitations House hold activities;Walking    How long can you sit comfortably? unlimited    How long can you stand comfortably? unlimited    How long can you walk comfortably? intermittently affected by imbalance/unsteadiness    Diagnostic tests Pt recently had VNG testing completed where per chart pt had no nystagmus or subjective dizziness with Gilberto Betterix Hallpike, no spontaneous nystagmus, pt noted to have abnormal smooth pursuits and saccades, abnormal optokinetic testing, no post headshake nystagmus, normal caloric testing and normal positional testing. Pt also had hearing testing completed where per chart pt with sensorineural hearing loss.    Patient Stated Goals I just want to feel more secure when I'm doing anything.    Currently in Pain? No/denies              TREATMENT  Manual Therapy PT provides STM primarily around B cervical paraspinals, and then B shoulder musculature while bringing pt through the following x 2-3 minutes for each: Upper trap stretch B Levator scap stretch B Stretch of cervical extensors Gentle manual cervical traction - pt reports no pain.  Pt shows improved cervical PROM.    TherEx- Seated cervical flexor stretch - 2x30 sec Seated cervical extensor stretch with chin tuck - 2x30 sec At support surface with pt wearing 3# AWs on BLES Standing hip abduction 3x10 Standing hip extension 3x10 Heel raises 2x10-15 reps    Neuro Re-Ed: At support surface- CGA to min a for the following Firm surface, NBOS EC - 30 sec  Firm surface, NBOS EC verticla head turns, horizontal head turns 10x for each, each  direction; Pt unsteady with both. Requires cuing to utilize cervical rotation (decreased AROM compared to PROM from earlier in session). Semi-tandem EC - 2x30 sec each LE Standing on airex WBOS EC - 2x30 sec Standing on airex NBOS EC - 2x30 sec Standing on airex, WBOS, EO, vertical and horizontal head-turns - 5x for each, each direction Tandem stance - 2x30 sec BLEs. Pt rates difficult. Uses frequent UE support.  Cone taps (SLB progression) - x multiple reps each LE. Very challenging for pt. Standing VORx1, vertical head turns, busy background - 3x30 sec. No decrease in postural stability, with some corrective saccades.      Pt educated throughout session about proper posture and technique with exercises. Improved exercise technique, movement at target joints, use of target muscles after min to mod verbal, visual, tactile cues.    PT Short Term Goals - 11/25/20 1036  PT SHORT TERM GOAL #1   Title Patient will be independent in home exercise program to improve strength/mobility and balance for increased safety with ADLs.    Baseline 8/5: to be initiated    Time 6    Period Weeks    Status New    Target Date 01/06/21               PT Long Term Goals - 11/25/20 1037       PT LONG TERM GOAL #1   Title Patient will reduce dizziness handicap inventory score by at least 13 points for less dizziness with ADLs and increased safety with home and work tasks.    Baseline 8/5: 28%    Time 12    Period Weeks    Status New    Target Date 02/17/21      PT LONG TERM GOAL #2   Title Patient will increase ABC scale score >80% to demonstrate better functional mobility and better confidence with ADLs.    Baseline 8/5: 66.25%    Time 12    Period Weeks    Status New    Target Date 02/17/21      PT LONG TERM GOAL #3   Title Patient will increase FOTO score to equal to or greater than 70 to demonstrate improvement in mobility and quality of life.    Baseline 8/5: 59    Time 12     Period Weeks    Status New    Target Date 02/17/21      PT LONG TERM GOAL #4   Title Patient will increase Berg Balance score by > 6 points to demonstrate decreased fall risk during functional activities.    Baseline 8/5: to be completed future session    Time 12    Period Weeks    Status New    Target Date 02/17/21      PT LONG TERM GOAL #5   Title Patient will tolerate 5 seconds of single leg stance without loss of balance to improve ability to get in and out of shower safely.    Baseline 8/5: Pt maintains 1-3 sec of SLB BLEs, reports she has fallen getting in/out of shower    Time 12    Period Weeks    Status New    Target Date 02/17/21                   Plan - 12/06/20 1419     Clinical Impression Statement Pt exhibits decreased use of available cervical ROM when attempting to perform head-turns actively compared to when pt passively brought into cervical rotation. This was most notable when pt was performing balance exercises that challenge use of vestibular cues/head motion. Overall, pt challenged with majority of balance training, requiring intermittent UE support and up to min a from PT to maintain balance. The pt will continue to benefit from further skilled PT to improve strength, ROM and balance to decrease fall risk.    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    Examination-Activity Limitations Bathing;Bend;Dressing;Stairs;Locomotion Level    Examination-Participation Restrictions Community Activity;Laundry;Yard Work;Cleaning;Driving;Meal Prep;Shop     Stability/Clinical Decision Making Evolving/Moderate complexity  Rehab Potential Good    PT Frequency 2x / week    PT Duration 12 weeks    PT Treatment/Interventions ADLs/Self Care Home Management;Canalith Repostioning;Cryotherapy;Electrical Stimulation;Moist Heat;Ultrasound;DME Instruction;Gait training;Stair training;Functional mobility training;Therapeutic activities;Therapeutic exercise;Balance training;Neuromuscular re-education;Patient/family education;Manual techniques;Orthotic Fit/Training;Passive range of motion;Dry needling;Energy conservation;Splinting;Taping;Vestibular;Visual/perceptual remediation/compensation;Joint Manipulations    PT Next Visit Plan cervical mobility, stretching, and balance, continue POC as previously indicated    PT Home Exercise Plan Seated VORx1 with vertical head turns, daily, in 30 second bouts for a total of 3 minutes; no updates    Consulted and Agree with Plan of Care Patient             Patient will benefit from skilled therapeutic intervention in order to improve the following deficits and impairments:  Abnormal gait, Decreased activity tolerance, Decreased range of motion, Decreased knowledge of use of DME, Decreased strength, Dizziness, Improper body mechanics, Decreased balance, Decreased mobility, Difficulty walking, Increased muscle spasms, Impaired flexibility, Postural dysfunction, Hypomobility  Visit Diagnosis: Unsteadiness on feet  Other lack of coordination  Muscle weakness (generalized)  Other abnormalities of gait and mobility     Problem List There are no problems to display for this patient.  Temple Pacini PT, DPT 12/06/2020, 2:26 PM  [facility] MAIN [facility] SERVICES [address] [place], [place], [address] Phone: [phone]   Fax:  [phone]  Name: MARTESHA NIEDERMEIER MRN: [id number] Date of Birth: 13-May-1950

## 2020-12-07 ENCOUNTER — Ambulatory Visit: Payer: Medicare Other

## 2020-12-12 ENCOUNTER — Ambulatory Visit: Payer: Medicare Other

## 2020-12-12 ENCOUNTER — Other Ambulatory Visit: Payer: Self-pay

## 2020-12-12 DIAGNOSIS — M6281 Muscle weakness (generalized): Secondary | ICD-10-CM

## 2020-12-12 DIAGNOSIS — R278 Other lack of coordination: Secondary | ICD-10-CM

## 2020-12-12 DIAGNOSIS — R2681 Unsteadiness on feet: Secondary | ICD-10-CM | POA: Diagnosis not present

## 2020-12-12 DIAGNOSIS — R2689 Other abnormalities of gait and mobility: Secondary | ICD-10-CM

## 2020-12-12 NOTE — Therapy (Signed)
[facility] MAIN [facility] SERVICES [address] [place], [place], [address] Phone: [phone]   Fax:  [phone]  Physical Therapy Treatment  Patient Details  Name: Desiree Ramirez MRN: [id number] Date of Birth: 11/23/1950 Referring Provider (PT): Linus Salmons, MD   Encounter Date: 12/12/2020   PT End of Session - 12/12/20 1033     Visit Number 4    Number of Visits 25    Date for PT Re-Evaluation 02/17/21    Authorization Type Medicare [employer]    Authorization Time Period 11/25/20-02/17/21    PT Start Time 1023   pt arrived late   PT Stop Time 1055    PT Time Calculation (min) 32 min    Activity Tolerance Patient tolerated treatment well;No increased pain    Behavior During Therapy WFL for tasks assessed/performed             Past Medical History:  Diagnosis Date   Allergic rhinitis    with eczema   Anemia    Asthma    without status asthmaticus; with allergic component   Colon polyps    Depression    Diabetes mellitus without complication (HCC)    History of carpal tunnel syndrome    bilateral   History of chest pain    negative stress test 05/2004   Hyperlipidemia    Hypertension    Idiopathic peripheral neuropathy    Microscopic hematuria    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 Caryn Section   Osteoarthritis    with history of whiplash   Sleep apnea    AHI 43.6, CPAP of 11 initiated   Vitamin D deficiency     Past Surgical History:  Procedure Laterality Date   ABDOMINAL HYSTERECTOMY     TAH/BSO with uterine enlargement, fibroids   CARPAL TUNNEL RELEASE Bilateral    CATARACT EXTRACTION Bilateral    COLONOSCOPY  10/16/2005   adenomatous polyp   COLONOSCOPY WITH PROPOFOL N/A 05/27/2015   Procedure: COLONOSCOPY WITH PROPOFOL;  Surgeon: Scot Jun, MD;  Location: [facility] ENDOSCOPY;  Service: Endoscopy;  Laterality: N/A;    ESOPHAGOGASTRODUODENOSCOPY (EGD) WITH PROPOFOL N/A 05/27/2015   Procedure: ESOPHAGOGASTRODUODENOSCOPY (EGD) WITH PROPOFOL;  Surgeon: Scot Jun, MD;  Location: [facility] ENDOSCOPY;  Service: Endoscopy;  Laterality: N/A;   INGUINAL HERNIA REPAIR Right 06/17/2015   Procedure: HERNIA REPAIR INGUINAL ADULT;  Surgeon: Nadeen Landau, MD;  Location: [facility] ORS;  Service: General;  Laterality: Right;    There were no vitals filed for this visit.   Subjective Assessment - 12/12/20 1027     Subjective Pt reports she is starting to feel better overall, tested postiive for Covid last tuesday (6 days ago) and basically slept/rested all week. Pt did not have the energy to work with her HEP.    Pertinent History Pt presents to PT ambulating without an AD. Pt reports her main problem is feelings of imbalance, not dizziness. She reports onset of imbalance started July 4th 2021, where she was preparing to go on vacation and noticed she was walking toward her L side. She says her balance with walking continued to worsen but has since "leveled out." She has fallen 2x in the last six months, denies hitting her head with falls. She feels most off balance with looking over her shoulder, bending/looking down, and sometimes with looking up. She feels she is still moving when she  is walking and tries to stop and says, "I have to get my footing." She denies any spinning sensation, although she reports she did recently get treated at ENT for "crystals in my ear," a few months ago and that it helped. She has also recently had hearing tested and reports she is unable to hear higher pitches. She denies aural fullness, drainage, but does report ringing in her ears. She denies any feelings of light-headedness. Pt reports no headaches or sensitivity to light. She reports sometimes she does feel nauseated but that she has "stomach problems" and recently had diverticulitis. Pt has history of UTIs and was recently treated for one "a week  ago," but reports no improvements in her balance/unsteadiness. Other PMH per chart includes anemia, ashtma, DM, B carpal tunnel syndrome, hx of chest pain (negative stress test 2006), hx stomach ulcers, HLD, HTN, idiopathic peripheral neuropathy, migraine, mood disorder, osteoarthritis w history of whiplash, sleep apnea, vitamin d deficiency, depression.    Currently in Pain? No/denies            INTERVENTION THIS DATE:   -Seated cervical flexor stretch - 2x30 sec -VOR gaze stabilization 2x30sec using pollux   At support surface with pt wearing 3# AWs on BLES -Standing hip abduction 3x15 -Standing hip extension 3x15 -Heel raises 2x20 reps -side stepping 42ft R and L -Retro stepping 2x52ft -horizontla head turns x15 -vertical head turns x15  -15 item transfer from counter height to opposite surface behind 1x firm surface, 1x soft surface         PT Short Term Goals - 11/25/20 1036       PT SHORT TERM GOAL #1   Title Patient will be independent in home exercise program to improve strength/mobility and balance for increased safety with ADLs.    Baseline 8/5: to be initiated    Time 6    Period Weeks    Status New    Target Date 01/06/21               PT Long Term Goals - 11/25/20 1037       PT LONG TERM GOAL #1   Title Patient will reduce dizziness handicap inventory score by at least 13 points for less dizziness with ADLs and increased safety with home and work tasks.    Baseline 8/5: 28%    Time 12    Period Weeks    Status New    Target Date 02/17/21      PT LONG TERM GOAL #2   Title Patient will increase ABC scale score >80% to demonstrate better functional mobility and better confidence with ADLs.    Baseline 8/5: 66.25%    Time 12    Period Weeks    Status New    Target Date 02/17/21      PT LONG TERM GOAL #3   Title Patient will increase FOTO score to equal to or greater than 70 to demonstrate improvement in mobility and quality of life.     Baseline 8/5: 59    Time 12    Period Weeks    Status New    Target Date 02/17/21      PT LONG TERM GOAL #4   Title Patient will increase Berg Balance score by > 6 points to demonstrate decreased fall risk during functional activities.    Baseline 8/5: to be completed future session    Time 12    Period Weeks    Status New  Target Date 02/17/21      PT LONG TERM GOAL #5   Title Patient will tolerate 5 seconds of single leg stance without loss of balance to improve ability to get in and out of shower safely.    Baseline 8/5: Pt maintains 1-3 sec of SLB BLEs, reports she has fallen getting in/out of shower    Time 12    Period Weeks    Status New    Target Date 02/17/21                   Plan - 12/12/20 1042     Clinical Impression Statement Continued with current plan of care as laid out in evaluation and recent prior sessions. Pt remains motivated to advance progress toward goals in order to maximize independence and safety at home. Pt requires high level assistance and cuing for completion of exercises in order to provide adequate level of stimulation and perturbation. Author allows pt as much opportunity as possible to perform independent righting strategies, only stepping in when pt is unable to prevent falling to floor. Pt continues to demonstrate progress toward goals AEB progression of some interventions this date either in volume or intensity.   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    Examination-Activity Limitations Bathing;Bend;Dressing;Stairs;Locomotion Level     Examination-Participation Restrictions Community Activity;Laundry;Yard Work;Cleaning;Driving;Meal Prep;Shop    Stability/Clinical Decision Making Evolving/Moderate complexity    Clinical Decision Making Moderate    Rehab Potential Good    PT Frequency 2x / week    PT Duration 12 weeks    PT Treatment/Interventions ADLs/Self Care Home Management;Canalith Repostioning;Cryotherapy;Electrical Stimulation;Moist Heat;Ultrasound;DME Instruction;Gait training;Stair training;Functional mobility training;Therapeutic activities;Therapeutic exercise;Balance training;Neuromuscular re-education;Patient/family education;Manual techniques;Orthotic Fit/Training;Passive range of motion;Dry needling;Energy conservation;Splinting;Taping;Vestibular;Visual/perceptual remediation/compensation;Joint Manipulations    PT Next Visit Plan cervical mobility, stretching, and balance, continue POC as previously indicated    PT Home Exercise Plan Seated VORx1 with vertical head turns, daily, in 30 second bouts for a total of 3 minutes; no updates 12/12/20    Consulted and Agree with Plan of Care Patient             Patient will benefit from skilled therapeutic intervention in order to improve the following deficits and impairments:  Abnormal gait, Decreased activity tolerance, Decreased range of motion, Decreased knowledge of use of DME, Decreased strength, Dizziness, Improper body mechanics, Decreased balance, Decreased mobility, Difficulty walking, Increased muscle spasms, Impaired flexibility, Postural dysfunction, Hypomobility  Visit Diagnosis: Unsteadiness on feet  Other lack of coordination  Muscle weakness (generalized)  Other abnormalities of gait and mobility     Problem List There are no problems to display for this patient.  11:02 AM, 12/12/20 Rosamaria Lints, PT, DPT Physical Therapist - [facility]  Outpatient Physical Therapy- Main Campus [phone]      Rosamaria Lints 12/12/2020, 10:47 AM  [facility] MAIN [facility] SERVICES [address] [place], [place], [address] Phone: [phone]   Fax:  [phone]  Name: Desiree Ramirez MRN: [id number] Date of Birth: [birth year]

## 2020-12-14 ENCOUNTER — Other Ambulatory Visit: Payer: Self-pay

## 2020-12-14 ENCOUNTER — Ambulatory Visit: Payer: Medicare Other

## 2020-12-14 DIAGNOSIS — R278 Other lack of coordination: Secondary | ICD-10-CM

## 2020-12-14 DIAGNOSIS — R2689 Other abnormalities of gait and mobility: Secondary | ICD-10-CM

## 2020-12-14 DIAGNOSIS — R2681 Unsteadiness on feet: Secondary | ICD-10-CM | POA: Diagnosis not present

## 2020-12-14 NOTE — Therapy (Signed)
[facility] MAIN [facility] SERVICES [address] [place], [place], [address] Phone: [phone]   Fax:  [phone]  Physical Therapy Treatment  Patient Details  Name: Desiree Ramirez MRN: [id number] Date of Birth: Aug 14, 1950 Referring Provider (PT): Linus Salmons, MD   Encounter Date: 12/14/2020   PT End of Session - 12/15/20 1604     Visit Number 5    Number of Visits 25    Date for PT Re-Evaluation 02/17/21    Authorization Type Medicare [employer] A[employer]    Authorization Time Period 11/25/20-02/17/21    PT Start Time 1432    PT Stop Time 1514    PT Time Calculation (min) 42 min    Equipment Utilized During Treatment Gait belt    Activity Tolerance Patient tolerated treatment well    Behavior During Therapy [facility] for tasks assessed/performed             Past Medical History:  Diagnosis Date   Allergic rhinitis    with eczema   Anemia    Asthma    without status asthmaticus; with allergic component   Colon polyps    Depression    Diabetes mellitus without complication (HCC)    History of carpal tunnel syndrome    bilateral   History of chest pain    negative stress test 05/2004   Hyperlipidemia    Hypertension    Idiopathic peripheral neuropathy    Microscopic hematuria    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 Caryn Section   Osteoarthritis    with history of whiplash   Sleep apnea    AHI 43.6, CPAP of 11 initiated   Vitamin D deficiency     Past Surgical History:  Procedure Laterality Date   ABDOMINAL HYSTERECTOMY     TAH/BSO with uterine enlargement, fibroids   CARPAL TUNNEL RELEASE Bilateral    CATARACT EXTRACTION Bilateral    COLONOSCOPY  10/16/2005   adenomatous polyp   COLONOSCOPY WITH PROPOFOL N/A 05/27/2015   Procedure: COLONOSCOPY WITH PROPOFOL;  Surgeon: Scot Jun, MD;  Location: [facility] ENDOSCOPY;  Service: Endoscopy;  Laterality: N/A;    ESOPHAGOGASTRODUODENOSCOPY (EGD) WITH PROPOFOL N/A 05/27/2015   Procedure: ESOPHAGOGASTRODUODENOSCOPY (EGD) WITH PROPOFOL;  Surgeon: Scot Jun, MD;  Location: [facility] ENDOSCOPY;  Service: Endoscopy;  Laterality: N/A;   INGUINAL HERNIA REPAIR Right 06/17/2015   Procedure: HERNIA REPAIR INGUINAL ADULT;  Surgeon: Nadeen Landau, MD;  Location: [facility] ORS;  Service: General;  Laterality: Right;    There were no vitals filed for this visit.   Subjective Assessment - 12/14/20 1436     Subjective Pt reports she tested positive for COVID last Tuesday. Pt reports she "got better fast." However, pt reports she has been feeling tired.    Pertinent History Pt presents to PT ambulating without an AD. Pt reports her main problem is feelings of imbalance, not dizziness. She reports onset of imbalance started July 4th 2021, where she was preparing to go on vacation and noticed she was walking toward her L side. She says her balance with walking continued to worsen but has since "leveled out." She has fallen 2x in the last six months, denies hitting her head with falls. She feels most off balance with looking over her shoulder, bending/looking down, and sometimes with looking up. She feels she is still moving when she is walking and tries to stop and says, "  I have to get my footing." She denies any spinning sensation, although she reports she did recently get treated at ENT for "crystals in my ear," a few months ago and that it helped. She has also recently had hearing tested and reports she is unable to hear higher pitches. She denies aural fullness, drainage, but does report ringing in her ears. She denies any feelings of light-headedness. Pt reports no headaches or sensitivity to light. She reports sometimes she does feel nauseated but that she has "stomach problems" and recently had diverticulitis. Pt has history of UTIs and was recently treated for one "a week ago," but reports no improvements in her  balance/unsteadiness. Other PMH per chart includes anemia, ashtma, DM, B carpal tunnel syndrome, hx of chest pain (negative stress test 2006), hx stomach ulcers, HLD, HTN, idiopathic peripheral neuropathy, migraine, mood disorder, osteoarthritis w history of whiplash, sleep apnea, vitamin d deficiency, depression.    Currently in Pain? No/denies              INTERVENTION THIS DATE:    Pt perform the following stretches while seated: Cervical flexors stretch 2x30 sec Cervical extensor stretch 2x430 sec Upper trap stretch 2x30 sec B Levator Scap stretch 2x30 sec B   Neuro Re-ed: close CGA provided throughout Obstacle course with navigating cones and stepping over hurdle- Pt hesitates with approach to hurdle, has difficulty clearing step, decrease in postural stability Stepping forward/backward over hurdle - x multiple reps; most challenged with backward step Ambulating in hallway, reading sticky notes placed on wall to incorporate mix of vertical and horizontal head-turns. Pt reports no sx with exercise.  Ambulating in hallway with vertical head-turns; pt exhibits some variability in BOS.  Standing on airex at support surface, CGA throughout: EC, WBOS - 4x30 sec; intermittent UE support, increased sway EO WBOS, with vertical head-turns - x multiple reps EO, WBOS, with horizontal head-turns - x multiple reps EC with vertical head-turns; requires up to min a in order to regain balance d/t LOB EC with horizontal head-turns; requires up to min a in order to regain balance d/t LOB On firm surface: SLB - x multiple 30 sec bouts per LE, challenging for pt.    PT discusses addition to HEP:  Access Code: [facility] URL: [email or web address] Date: 12/14/2020 Prepared by: Temple Pacini  Exercises Single Leg Stance with Support - 1 x daily - 7 x weekly - 2 sets - 2 reps - 30 hold  Instructs pt to perform at support surface, begin with UE support, with chair behind her and  her spouse nearby for safety. Instructs pt to not perform exercise if she is having a particularly bad balance day. Pt and spouse verbalize understanding.       PT Education - 12/15/20 1603     Education Details exercise technique, body mechanics    Person(s) Educated Patient    Methods Explanation;Demonstration;Verbal cues    Comprehension Verbalized understanding;Returned demonstration              PT Short Term Goals - 11/25/20 1036       PT SHORT TERM GOAL #1   Title Patient will be independent in home exercise program to improve strength/mobility and balance for increased safety with ADLs.    Baseline 8/5: to be initiated    Time 6    Period Weeks    Status New    Target Date 01/06/21               PT Long  Term Goals - 11/25/20 1037       PT LONG TERM GOAL #1   Title Patient will reduce dizziness handicap inventory score by at least 13 points for less dizziness with ADLs and increased safety with home and work tasks.    Baseline 8/5: 28%    Time 12    Period Weeks    Status New    Target Date 02/17/21      PT LONG TERM GOAL #2   Title Patient will increase ABC scale score >80% to demonstrate better functional mobility and better confidence with ADLs.    Baseline 8/5: 66.25%    Time 12    Period Weeks    Status New    Target Date 02/17/21      PT LONG TERM GOAL #3   Title Patient will increase FOTO score to equal to or greater than 70 to demonstrate improvement in mobility and quality of life.    Baseline 8/5: 59    Time 12    Period Weeks    Status New    Target Date 02/17/21      PT LONG TERM GOAL #4   Title Patient will increase Berg Balance score by > 6 points to demonstrate decreased fall risk during functional activities.    Baseline 8/5: to be completed future session    Time 12    Period Weeks    Status New    Target Date 02/17/21      PT LONG TERM GOAL #5   Title Patient will tolerate 5 seconds of single leg stance without loss of  balance to improve ability to get in and out of shower safely.    Baseline 8/5: Pt maintains 1-3 sec of SLB BLEs, reports she has fallen getting in/out of shower    Time 12    Period Weeks    Status New    Target Date 02/17/21                   Plan - 12/15/20 1610     Clinical Impression Statement Pt making progress in therapy as indicated by reports of perceived overall functional improvement. However, pt still challenged with majority of balance interventions requiring from CGA-min a from PT to maintain balance. She is most challenged with SLB intervention, and compliant surface training. The pt also requires continued cuing to utilize cervical ROM, particularly in the horizontal plane. The pt will benefit from further skilled PT to continue to improve cervical mobility, LE strength and balance in order to decrease fall risk.    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    Examination-Activity Limitations Bathing;Bend;Dressing;Stairs;Locomotion Level    Examination-Participation Restrictions Community Activity;Laundry;Yard Work;Cleaning;Driving;Meal Prep;Shop    Stability/Clinical Decision Making Evolving/Moderate complexity    Rehab Potential Good    PT Frequency 2x / week    PT Duration 12 weeks    PT Treatment/Interventions ADLs/Self Care Home Management;Canalith Repostioning;Cryotherapy;Electrical Stimulation;Moist Heat;Ultrasound;DME Instruction;Gait training;Stair training;Functional mobility training;Therapeutic activities;Therapeutic exercise;Balance training;Neuromuscular  re-education;Patient/family education;Manual techniques;Orthotic Fit/Training;Passive range of motion;Dry needling;Energy conservation;Splinting;Taping;Vestibular;Visual/perceptual remediation/compensation;Joint Manipulations    PT Next Visit Plan cervical mobility, stretching, and balance, continue POC as previously indicated    PT  Home Exercise Plan Seated VORx1 with vertical head turns, daily, in 30 second bouts for a total of 3 minutes; no updates 12/12/20; 8/24:  Access Code: [email or web address]    Consulted and Agree with Plan of Care Patient             Patient will benefit from skilled therapeutic intervention in order to improve the following deficits and impairments:  Abnormal gait, Decreased activity tolerance, Decreased range of motion, Decreased knowledge of use of DME, Decreased strength, Dizziness, Improper body mechanics, Decreased balance, Decreased mobility, Difficulty walking, Increased muscle spasms, Impaired flexibility, Postural dysfunction, Hypomobility  Visit Diagnosis: Unsteadiness on feet  Other lack of coordination  Other abnormalities of gait and mobility     Problem List There are no problems to display for this patient.  Temple PaciniHaley Placido Hangartner PT, DPT  12/15/2020, 4:15 PM  [facility] MAIN [facility] [address] [place], [place], [address] Phone: [phone]   Fax:  [phone]  Name: Harvie Bridgeileen M Yasin MRN: [id number] Date of Birth: [birth year]

## 2020-12-19 ENCOUNTER — Ambulatory Visit: Payer: Medicare Other

## 2020-12-19 ENCOUNTER — Other Ambulatory Visit: Payer: Self-pay

## 2020-12-19 DIAGNOSIS — R2681 Unsteadiness on feet: Secondary | ICD-10-CM

## 2020-12-19 DIAGNOSIS — R278 Other lack of coordination: Secondary | ICD-10-CM

## 2020-12-19 DIAGNOSIS — R2689 Other abnormalities of gait and mobility: Secondary | ICD-10-CM

## 2020-12-19 DIAGNOSIS — M6281 Muscle weakness (generalized): Secondary | ICD-10-CM

## 2020-12-19 NOTE — Therapy (Signed)
[facility] MAIN [facility] SERVICES [address] [place], [place], [address] Phone: [phone]   Fax:  [phone]  Physical Therapy Treatment  Patient Details  Name: Desiree Ramirez MRN: [id number] Date of Birth: Dec 22, 1950 Referring Provider (PT): Linus Salmons, MD   Encounter Date: 12/19/2020   PT End of Session - 12/20/20 0809     Visit Number 6    Number of Visits 25    Date for PT Re-Evaluation 02/17/21    Authorization Type Medicare [employer] A[employer]    Authorization Time Period 11/25/20-02/17/21    PT Start Time 1029    PT Stop Time 1100    PT Time Calculation (min) 31 min    Equipment Utilized During Treatment Gait belt    Activity Tolerance Patient tolerated treatment well    Behavior During Therapy [facility] for tasks assessed/performed             Past Medical History:  Diagnosis Date   Allergic rhinitis    with eczema   Anemia    Asthma    without status asthmaticus; with allergic component   Colon polyps    Depression    Diabetes mellitus without complication (HCC)    History of carpal tunnel syndrome    bilateral   History of chest pain    negative stress test 05/2004   Hyperlipidemia    Hypertension    Idiopathic peripheral neuropathy    Microscopic hematuria    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 Caryn Section   Osteoarthritis    with history of whiplash   Sleep apnea    AHI 43.6, CPAP of 11 initiated   Vitamin D deficiency     Past Surgical History:  Procedure Laterality Date   ABDOMINAL HYSTERECTOMY     TAH/BSO with uterine enlargement, fibroids   CARPAL TUNNEL RELEASE Bilateral    CATARACT EXTRACTION Bilateral    COLONOSCOPY  10/16/2005   adenomatous polyp   COLONOSCOPY WITH PROPOFOL N/A 05/27/2015   Procedure: COLONOSCOPY WITH PROPOFOL;  Surgeon: Scot Jun, MD;  Location: [facility] ENDOSCOPY;  Service: Endoscopy;  Laterality: N/A;    ESOPHAGOGASTRODUODENOSCOPY (EGD) WITH PROPOFOL N/A 05/27/2015   Procedure: ESOPHAGOGASTRODUODENOSCOPY (EGD) WITH PROPOFOL;  Surgeon: Scot Jun, MD;  Location: [facility] ENDOSCOPY;  Service: Endoscopy;  Laterality: N/A;   INGUINAL HERNIA REPAIR Right 06/17/2015   Procedure: HERNIA REPAIR INGUINAL ADULT;  Surgeon: Nadeen Landau, MD;  Location: [facility] ORS;  Service: General;  Laterality: Right;    There were no vitals filed for this visit.   Subjective Assessment - 12/19/20 1029     Subjective Pt reports balance has been good. Hoewever, pt reports continued perceived deficits in strength and balance in her LEs. Pt reports she has not been performing HEP.    Pertinent History Pt presents to PT ambulating without an AD. Pt reports her main problem is feelings of imbalance, not dizziness. She reports onset of imbalance started July 4th 2021, where she was preparing to go on vacation and noticed she was walking toward her L side. She says her balance with walking continued to worsen but has since "leveled out." She has fallen 2x in the last six months, denies hitting her head with falls. She feels most off balance with looking over her shoulder, bending/looking down, and sometimes with looking up. She feels she is still moving when she is walking and tries  to stop and says, "I have to get my footing." She denies any spinning sensation, although she reports she did recently get treated at ENT for "crystals in my ear," a few months ago and that it helped. She has also recently had hearing tested and reports she is unable to hear higher pitches. She denies aural fullness, drainage, but does report ringing in her ears. She denies any feelings of light-headedness. Pt reports no headaches or sensitivity to light. She reports sometimes she does feel nauseated but that she has "stomach problems" and recently had diverticulitis. Pt has history of UTIs and was recently treated for one "a week ago," but reports no  improvements in her balance/unsteadiness. Other PMH per chart includes anemia, ashtma, DM, B carpal tunnel syndrome, hx of chest pain (negative stress test 2006), hx stomach ulcers, HLD, HTN, idiopathic peripheral neuropathy, migraine, mood disorder, osteoarthritis w history of whiplash, sleep apnea, vitamin d deficiency, depression.    Currently in Pain? No/denies              INTERVENTION THIS DATE:   Seated VORx1 with horizontal, vertical head turns - 4x30 for each; pt reports neck feels "stiff but not painful," otherwise pt reports no sx. No observed corrective saccades.   Cervical rotation - 2x30 sec B Cervical flexors stretch - 60 sec  At support surface - CGA-min a provided throughout SLB - 4x30 sec BLEs; pt rates hard Tandem stance - 2x30 sec BLEs  Standing on airex at support surface, CGA throughout: Standing on airex EC - 90 sec Standing on airex with EC with horizontal head-turns; requires up to min a in order to regain balance. Rates hard. 10x each direction. Up to min a for both d/t LOB to regain balance   Standing hip abduction 2x20 BLEs; rates easy  PT reinforces importance of HEP. Pt verbalizes understanding.     Plan - 12/20/20 0813     Clinical Impression Statement Session limited d/t pt late arrival. Pt without sx/feelings of imbalance or observed corrective saccades with seated VOR exercises. She does exhibit rigid movement of cervical rotation when trying to perform interventions and reports feelings of stiffness in her neck. PT reinforced importance of HEP this session as pt has not been performing HEP. Pt verbalized understanding. The pt will benefit from further skilled PT to continue to address deficits affecting pt's balance in order to decrease fall risk.    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    Examination-Activity Limitations Bathing;Bend;Dressing;Stairs;Locomotion Level    Examination-Participation Restrictions Community Activity;Laundry;Yard Work;Cleaning;Driving;Meal Prep;Shop    Stability/Clinical Decision Making Evolving/Moderate complexity    Rehab Potential Good    PT Frequency 2x / week    PT Duration 12 weeks    PT Treatment/Interventions ADLs/Self Care Home Management;Canalith Repostioning;Cryotherapy;Electrical Stimulation;Moist Heat;Ultrasound;DME Instruction;Gait training;Stair training;Functional mobility training;Therapeutic activities;Therapeutic exercise;Balance training;Neuromuscular re-education;Patient/family education;Manual techniques;Orthotic Fit/Training;Passive range of motion;Dry needling;Energy conservation;Splinting;Taping;Vestibular;Visual/perceptual remediation/compensation;Joint Manipulations    PT Next Visit Plan cervical mobility, stretching, and balance, continue POC as previously indicated    PT Home Exercise Plan Seated VORx1 with vertical head turns, daily, in 30 second bouts for a total of 3 minutes; no updates 12/12/20; 8/24:  [email or web address]  Consulted and Agree with Plan of Care Patient                PT Short Term Goals - 11/25/20 1036       PT SHORT TERM GOAL #1   Title Patient will be independent in home exercise program to improve strength/mobility and balance for increased safety with ADLs.    Baseline 8/5: to be initiated    Time 6    Period Weeks    Status New    Target Date 01/06/21               PT Long Term Goals - 11/25/20 1037       PT LONG TERM GOAL #1   Title Patient will reduce dizziness handicap inventory score by at least 13 points for less  dizziness with ADLs and increased safety with home and work tasks.    Baseline 8/5: 28%    Time 12    Period Weeks    Status New    Target Date 02/17/21      PT LONG TERM GOAL #2   Title Patient will increase ABC scale score >80% to demonstrate better functional mobility and better confidence with ADLs.    Baseline 8/5: 66.25%    Time 12    Period Weeks    Status New    Target Date 02/17/21      PT LONG TERM GOAL #3   Title Patient will increase FOTO score to equal to or greater than 70 to demonstrate improvement in mobility and quality of life.    Baseline 8/5: 59    Time 12    Period Weeks    Status New    Target Date 02/17/21      PT LONG TERM GOAL #4   Title Patient will increase Berg Balance score by > 6 points to demonstrate decreased fall risk during functional activities.    Baseline 8/5: to be completed future session    Time 12    Period Weeks    Status New    Target Date 02/17/21      PT LONG TERM GOAL #5   Title Patient will tolerate 5 seconds of single leg stance without loss of balance to improve ability to get in and out of shower safely.    Baseline 8/5: Pt maintains 1-3 sec of SLB BLEs, reports she has fallen getting in/out of shower    Time 12    Period Weeks    Status New    Target Date 02/17/21                   Plan - 12/20/20 0813     Clinical Impression Statement Session limited d/t pt late arrival. Pt without sx/feelings of imbalance or observed corrective saccades with seated VOR exercises. She does exhibit rigid movement of cervical rotation when trying to perform interventions and reports feelings of stiffness in her neck. PT reinforced importance of HEP this session as pt has not been performing HEP. Pt verbalized understanding. The pt will benefit from further skilled PT to continue to address deficits affecting pt's balance in order to decrease fall risk.    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    Examination-Activity Limitations Bathing;Bend;Dressing;Stairs;Locomotion Level    Examination-Participation Restrictions Community Activity;Laundry;Yard Work;Cleaning;Driving;Meal Prep;Shop    Stability/Clinical Decision Making Evolving/Moderate complexity    Rehab Potential Good    PT Frequency 2x / week    PT Duration 12 weeks    PT Treatment/Interventions ADLs/Self Care Home Management;Canalith Repostioning;Cryotherapy;Electrical Stimulation;Moist Heat;Ultrasound;DME Instruction;Gait training;Stair training;Functional mobility training;Therapeutic activities;Therapeutic exercise;Balance training;Neuromuscular re-education;Patient/family education;Manual techniques;Orthotic Fit/Training;Passive range of motion;Dry needling;Energy conservation;Splinting;Taping;Vestibular;Visual/perceptual remediation/compensation;Joint Manipulations    PT Next Visit Plan cervical mobility, stretching, and balance, continue POC as previously indicated    PT Home Exercise Plan Seated VORx1 with vertical head turns, daily, in 30 second bouts for a total of 3 minutes; no updates 12/12/20; 8/24:  Access Code: [email or web address]    Consulted and Agree with Plan of Care Patient             Patient will benefit from skilled therapeutic intervention in order to improve the following deficits and impairments:  Abnormal gait, Decreased activity tolerance, Decreased range of motion, Decreased knowledge of use of DME, Decreased strength, Dizziness, Improper body mechanics, Decreased balance, Decreased mobility, Difficulty walking, Increased muscle  spasms, Impaired flexibility, Postural dysfunction, Hypomobility  Visit Diagnosis: Unsteadiness on feet  Other lack of coordination  Muscle weakness (generalized)  Other abnormalities of gait and mobility     Problem List There are no problems to display for this patient.  Temple Pacini PT, DPT  12/20/2020, 8:17 AM  [place] [facility] MAIN [facility] SERVICES [address] [place], [place], [address] Phone: [phone]   Fax:  [phone]  Name: Desiree Ramirez MRN: [id number] Date of Birth: 06/02/1950

## 2020-12-21 ENCOUNTER — Ambulatory Visit: Payer: Medicare Other

## 2020-12-28 ENCOUNTER — Ambulatory Visit: Payer: Medicare Other | Attending: Unknown Physician Specialty

## 2020-12-28 ENCOUNTER — Other Ambulatory Visit: Payer: Self-pay

## 2020-12-28 DIAGNOSIS — R2689 Other abnormalities of gait and mobility: Secondary | ICD-10-CM | POA: Diagnosis present

## 2020-12-28 DIAGNOSIS — R2681 Unsteadiness on feet: Secondary | ICD-10-CM | POA: Diagnosis not present

## 2020-12-28 DIAGNOSIS — R278 Other lack of coordination: Secondary | ICD-10-CM | POA: Diagnosis present

## 2020-12-28 NOTE — Therapy (Signed)
[facility] MAIN [facility] SERVICES [address] [place], [place], [address] Phone: [phone]   Fax:  [phone]  Physical Therapy Treatment  Patient Details  Name: Desiree Ramirez MRN: [id number] Date of Birth: 25-Jul-1950 Referring Provider (PT): Linus Salmons, MD   Encounter Date: 12/28/2020   PT End of Session - 12/28/20 1727     Visit Number 7    Number of Visits 25    Date for PT Re-Evaluation 02/17/21    Authorization Type Medicare [employer]    Authorization Time Period 11/25/20-02/17/21    PT Start Time 1433    PT Stop Time 1514    PT Time Calculation (min) 41 min    Equipment Utilized During Treatment Gait belt    Activity Tolerance Patient tolerated treatment well    Behavior During Therapy [facility] for tasks assessed/performed             Past Medical History:  Diagnosis Date   Allergic rhinitis    with eczema   Anemia    Asthma    without status asthmaticus; with allergic component   Colon polyps    Depression    Diabetes mellitus without complication (HCC)    History of carpal tunnel syndrome    bilateral   History of chest pain    negative stress test 05/2004   Hyperlipidemia    Hypertension    Idiopathic peripheral neuropathy    Microscopic hematuria    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 Caryn Section   Osteoarthritis    with history of whiplash   Sleep apnea    AHI 43.6, CPAP of 11 initiated   Vitamin D deficiency     Past Surgical History:  Procedure Laterality Date   ABDOMINAL HYSTERECTOMY     TAH/BSO with uterine enlargement, fibroids   CARPAL TUNNEL RELEASE Bilateral    CATARACT EXTRACTION Bilateral    COLONOSCOPY  10/16/2005   adenomatous polyp   COLONOSCOPY WITH PROPOFOL N/A 05/27/2015   Procedure: COLONOSCOPY WITH PROPOFOL;  Surgeon: Scot Jun, MD;  Location: [facility] ENDOSCOPY;  Service: Endoscopy;  Laterality: N/A;    ESOPHAGOGASTRODUODENOSCOPY (EGD) WITH PROPOFOL N/A 05/27/2015   Procedure: ESOPHAGOGASTRODUODENOSCOPY (EGD) WITH PROPOFOL;  Surgeon: Scot Jun, MD;  Location: [facility] ENDOSCOPY;  Service: Endoscopy;  Laterality: N/A;   INGUINAL HERNIA REPAIR Right 06/17/2015   Procedure: HERNIA REPAIR INGUINAL ADULT;  Surgeon: Nadeen Landau, MD;  Location: [facility] ORS;  Service: General;  Laterality: Right;    There were no vitals filed for this visit.   Subjective Assessment - 12/28/20 1437     Subjective Pt states "I have been off balance the last couple days." She reports feels off-balance with walking on carpet and bending. Pt denies feeling light headed. Pt reports she was very clumsy this weekend. She says she was walking into things and bumping things with her arms.    Pertinent History Pt presents to PT ambulating without an AD. Pt reports her main problem is feelings of imbalance, not dizziness. She reports onset of imbalance started July 4th 2021, where she was preparing to go on vacation and noticed she was walking toward her L side. She says her balance with walking continued to worsen but has since "leveled out." She has fallen 2x in the last six months, denies hitting her head with falls. She feels most off balance with looking over her  shoulder, bending/looking down, and sometimes with looking up. She feels she is still moving when she is walking and tries to stop and says, "I have to get my footing." She denies any spinning sensation, although she reports she did recently get treated at ENT for "crystals in my ear," a few months ago and that it helped. She has also recently had hearing tested and reports she is unable to hear higher pitches. She denies aural fullness, drainage, but does report ringing in her ears. She denies any feelings of light-headedness. Pt reports no headaches or sensitivity to light. She reports sometimes she does feel nauseated but that she has "stomach problems" and recently  had diverticulitis. Pt has history of UTIs and was recently treated for one "a week ago," but reports no improvements in her balance/unsteadiness. Other PMH per chart includes anemia, ashtma, DM, B carpal tunnel syndrome, hx of chest pain (negative stress test 2006), hx stomach ulcers, HLD, HTN, idiopathic peripheral neuropathy, migraine, mood disorder, osteoarthritis w history of whiplash, sleep apnea, vitamin d deficiency, depression.    Currently in Pain? No/denies            INTERVENTION THIS DATE:   Cervical rotation - 2x30 sec B Upper trap stretch - 60 sec B Cervical flexors stretch - 60 sec   Standing VORx1 with horizontal, vertical head turns - 4x30-45 for each; continued difficulty with cervical rotation, otherwise pt reports no sx. No observed corrective saccades.  -progressed to pt standing on airex pad, WBOS, 2 instances of posterior LOB   -progressed to vertical head turns with NBOS, challenging  At support surface standing on Airex with eyes closed -2x30 sec very challenging, remittent upper extremity support  Ambulation over red mat for compliant surface to simulate walking outdoors with ankle weights placed underneath mat to increase challenge -walking forward, backwards and sidestepping for multiple minutes; PT provides contact-guard assist to min assist  Airex beam walking (tandem stance) -10 times back-and-forth, pt rates medium, however patient very unsteady and uses frequent upper extremity support on bar to regain balance ---progressed to side stepping, continues to exhibit decreased postural stability and requires intermittent upper extremity support  Cone taps 20x, must use intermittent upper extremity support    PT Education - 12/28/20 1727     Education Details Exercise technique and body mechanics with cone taps    Person(s) Educated Patient    Methods Explanation;Demonstration;Verbal cues    Comprehension Verbalized understanding;Returned  demonstration;Need further instruction;Verbal cues required              Note: Portions of this document were prepared using Dragon voice recognition software and although reviewed may contain unintentional dictation errors in syntax, grammar, or spelling.    PT Short Term Goals - 11/25/20 1036       PT SHORT TERM GOAL #1   Title Patient will be independent in home exercise program to improve strength/mobility and balance for increased safety with ADLs.    Baseline 8/5: to be initiated    Time 6    Period Weeks    Status New    Target Date 01/06/21               PT Long Term Goals - 11/25/20 1037       PT LONG TERM GOAL #1   Title Patient will reduce dizziness handicap inventory score by at least 13 points for less dizziness with ADLs and increased safety with home and work tasks.    Baseline  8/5: 28%    Time 12    Period Weeks    Status New    Target Date 02/17/21      PT LONG TERM GOAL #2   Title Patient will increase ABC scale score >80% to demonstrate better functional mobility and better confidence with ADLs.    Baseline 8/5: 66.25%    Time 12    Period Weeks    Status New    Target Date 02/17/21      PT LONG TERM GOAL #3   Title Patient will increase FOTO score to equal to or greater than 70 to demonstrate improvement in mobility and quality of life.    Baseline 8/5: 59    Time 12    Period Weeks    Status New    Target Date 02/17/21      PT LONG TERM GOAL #4   Title Patient will increase Berg Balance score by > 6 points to demonstrate decreased fall risk during functional activities.    Baseline 8/5: to be completed future session    Time 12    Period Weeks    Status New    Target Date 02/17/21      PT LONG TERM GOAL #5   Title Patient will tolerate 5 seconds of single leg stance without loss of balance to improve ability to get in and out of shower safely.    Baseline 8/5: Pt maintains 1-3 sec of SLB BLEs, reports she has fallen getting in/out  of shower    Time 12    Period Weeks    Status New    Target Date 02/17/21              Plan - 12/28/20 1728     Clinical Impression Statement Patient progressed to standing VOR on compliant surface training.  She exhibited 2 instances of loss of balance posteriorly, requiring upper extremity support and min assist from PT to regain balance.  Intervention for ambulating on compliant surfaces also introduced today, which was very challenging for patient.  Pt had to frequently use step strategies to regain balance, exhibited FOF.  The patient will benefit from further skilled PT services to improve balance in order to decrease fall risk.    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    Examination-Activity Limitations Bathing;Bend;Dressing;Stairs;Locomotion Level    Examination-Participation Restrictions Community Activity;Laundry;Yard Work;Cleaning;Driving;Meal Prep;Shop    Stability/Clinical Decision Making Evolving/Moderate complexity    Rehab Potential Good    PT Frequency 2x / week    PT Duration 12 weeks    PT Treatment/Interventions ADLs/Self Care Home Management;Canalith Repostioning;Cryotherapy;Electrical Stimulation;Moist Heat;Ultrasound;DME Instruction;Gait training;Stair training;Functional mobility training;Therapeutic activities;Therapeutic exercise;Balance training;Neuromuscular re-education;Patient/family education;Manual techniques;Orthotic Fit/Training;Passive range of motion;Dry needling;Energy conservation;Splinting;Taping;Vestibular;Visual/perceptual  remediation/compensation;Joint Manipulations    PT Next Visit Plan cervical mobility, stretching, and balance, continue POC as previously indicated    PT Home Exercise Plan Seated VORx1 with vertical head turns, daily, in 30 second bouts for a total of 3 minutes; no updates 12/12/20; 8/24:  Access Code: [email or web address]    Consulted and Agree with Plan of Care Patient             Patient will benefit from skilled  therapeutic intervention in order to improve the following deficits and impairments:  Abnormal gait, Decreased activity tolerance, Decreased range of motion, Decreased knowledge of use of DME, Decreased strength, Dizziness, Improper body mechanics, Decreased balance, Decreased mobility, Difficulty walking, Increased muscle spasms, Impaired flexibility, Postural dysfunction, Hypomobility  Visit Diagnosis: Unsteadiness on feet  Other abnormalities of gait and mobility  Other lack of coordination   Problem List There are no problems to display for this patient.   Baird KayHaley R Lona Six, PT 12/28/2020, 5:37 PM  [facility] [address] [place], [place], [address] Phone: [phone]   Fax:  [phone]  Name: Harvie Bridgeileen M Lahmann MRN: [id number] Date of Birth: May 14, 1950

## 2021-01-02 ENCOUNTER — Ambulatory Visit: Payer: Medicare Other

## 2021-01-04 ENCOUNTER — Ambulatory Visit: Payer: Medicare Other

## 2021-01-09 ENCOUNTER — Ambulatory Visit: Payer: Medicare Other

## 2021-01-11 ENCOUNTER — Ambulatory Visit: Payer: Medicare Other

## 2021-01-11 ENCOUNTER — Other Ambulatory Visit: Payer: Self-pay

## 2021-01-11 DIAGNOSIS — R2681 Unsteadiness on feet: Secondary | ICD-10-CM

## 2021-01-11 DIAGNOSIS — R2689 Other abnormalities of gait and mobility: Secondary | ICD-10-CM

## 2021-01-11 DIAGNOSIS — R278 Other lack of coordination: Secondary | ICD-10-CM

## 2021-01-11 NOTE — Therapy (Signed)
[facility] MAIN [facility] SERVICES [address] [place], [place], [address] Phone: [phone]   Fax:  [phone]  Physical Therapy Treatment  Patient Details  Name: Desiree Ramirez MRN: [id number] Date of Birth: [birth year] Referring Provider (PT): Linus Salmons, MD   Encounter Date: 01/11/2021   PT End of Session - 01/11/21 1729     Visit Number 8    Number of Visits 25    Date for PT Re-Evaluation 02/17/21    Authorization Type Medicare [employer] A[employer]B    Authorization Time Period 11/25/20-02/17/21    PT Start Time 0938    PT Stop Time 1015    PT Time Calculation (min) 37 min    Equipment Utilized During Treatment Gait belt    Activity Tolerance Patient tolerated treatment well    Behavior During Therapy [facility] for tasks assessed/performed             Past Medical History:  Diagnosis Date   Allergic rhinitis    with eczema   Anemia    Asthma    without status asthmaticus; with allergic component   Colon polyps    Depression    Diabetes mellitus without complication (HCC)    History of carpal tunnel syndrome    bilateral   History of chest pain    negative stress test 05/2004   Hyperlipidemia    Hypertension    Idiopathic peripheral neuropathy    Microscopic hematuria    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 Caryn Section   Osteoarthritis    with history of whiplash   Sleep apnea    AHI 43.6, CPAP of 11 initiated   Vitamin D deficiency     Past Surgical History:  Procedure Laterality Date   ABDOMINAL HYSTERECTOMY     TAH/BSO with uterine enlargement, fibroids   CARPAL TUNNEL RELEASE Bilateral    CATARACT EXTRACTION Bilateral    COLONOSCOPY  10/16/2005   adenomatous polyp   COLONOSCOPY WITH PROPOFOL N/A 05/27/2015   Procedure: COLONOSCOPY WITH PROPOFOL;  Surgeon: Scot Jun, MD;  Location: [facility] ENDOSCOPY;  Service: Endoscopy;  Laterality: N/A;    ESOPHAGOGASTRODUODENOSCOPY (EGD) WITH PROPOFOL N/A 05/27/2015   Procedure: ESOPHAGOGASTRODUODENOSCOPY (EGD) WITH PROPOFOL;  Surgeon: Scot Jun, MD;  Location: [facility] ENDOSCOPY;  Service: Endoscopy;  Laterality: N/A;   INGUINAL HERNIA REPAIR Right 06/17/2015   Procedure: HERNIA REPAIR INGUINAL ADULT;  Surgeon: Nadeen Landau, MD;  Location: [facility] ORS;  Service: General;  Laterality: Right;    There were no vitals filed for this visit.   Subjective Assessment - 01/11/21 0938     Subjective Pt reports she was [place] in the [place]. She reports she went for a few hikes. She reports balance was ok until she went [place] and that's where she felt off balance. She reports no stumbles or falls. Pt reports no pain.    Pertinent History Pt presents to PT ambulating without an AD. Pt reports her main problem is feelings of imbalance, not dizziness. She reports onset of imbalance started July 4th 2021, where she was preparing to go on vacation and noticed she was walking toward her L side. She says her balance with walking continued to worsen but has since "leveled out." She has fallen 2x in the last six months, denies hitting her head with falls. She feels most off balance with looking over her shoulder, bending/looking down,  and sometimes with looking up. She feels she is still moving when she is walking and tries to stop and says, "I have to get my footing." She denies any spinning sensation, although she reports she did recently get treated at ENT for "crystals in my ear," a few months ago and that it helped. She has also recently had hearing tested and reports she is unable to hear higher pitches. She denies aural fullness, drainage, but does report ringing in her ears. She denies any feelings of light-headedness. Pt reports no headaches or sensitivity to light. She reports sometimes she does feel nauseated but that she has "stomach problems" and recently had diverticulitis. Pt has history of  UTIs and was recently treated for one "a week ago," but reports no improvements in her balance/unsteadiness. Other PMH per chart includes anemia, ashtma, DM, B carpal tunnel syndrome, hx of chest pain (negative stress test 2006), hx stomach ulcers, HLD, HTN, idiopathic peripheral neuropathy, migraine, mood disorder, osteoarthritis w history of whiplash, sleep apnea, vitamin d deficiency, depression.    Currently in Pain? No/denies                 INTERVENTION THIS DATE:    Seated: Cervical rotation - 2x30 sec B Upper trap stretch - 60 sec B Cervical flexors stretch - 60 sec Cervical extensors stretch initiated with chin tuck - 60 sec   At support surface, standing on Airex pad, with contact-guard assist to min assist to help patient maintain balance after loss of balance: VORx1 with horizontal, vertical head turns, NOBS -multiple reps for each of 30 to 45 seconds balance; horizontal head turns continue to be more challenging than vertical head turns.  Patient exhibits multiple posterior loss of balance with vertical head turns and reports she is not always aware of when she is losing her balance backwards until she feels tactile cues from PT. patient reports greater awareness of balance with vertical head turns and exhibits reaching strategy for support bar when losing balance.   The patient then progresses to standing on the Airex pad with a wide base of support with eyes closed -2 x 60 seconds; very challenging for patient, frequent use of upper extremity support, patient with increased rigidity and posture. -- Progresses to eyes closed, wide base of support with vertical and horizontal head turns 10 reps each direction for each   Ambulating over 10 meter track with vertical and horizontal head turns - 4x length of track for each, more challenged with horizontal head turns, increased variability in BOS, PT provides close contact-guard assist.  Cuing throughout for patient to rotate head  more to the left.  Patient perform single-leg balance at support surface- 4x30 sec, patient must use at least 2 finger support on bar due to difficulty maintaining balance  Note: Portions of this document were prepared using Dragon voice recognition software and although reviewed may contain unintentional dictation errors in syntax, grammar, or spelling.    PT Education - 01/11/21 1729     Education Details Exercise technique, body mechanics    Person(s) Educated Patient    Methods Explanation;Demonstration;Verbal cues    Comprehension Verbalized understanding;Returned demonstration;Need further instruction              PT Short Term Goals - 11/25/20 1036       PT SHORT TERM GOAL #1   Title Patient will be independent in home exercise program to improve strength/mobility and balance for increased safety with ADLs.    Baseline  8/5: to be initiated    Time 6    Period Weeks    Status New    Target Date 01/06/21               PT Long Term Goals - 11/25/20 1037       PT LONG TERM GOAL #1   Title Patient will reduce dizziness handicap inventory score by at least 13 points for less dizziness with ADLs and increased safety with home and work tasks.    Baseline 8/5: 28%    Time 12    Period Weeks    Status New    Target Date 02/17/21      PT LONG TERM GOAL #2   Title Patient will increase ABC scale score >80% to demonstrate better functional mobility and better confidence with ADLs.    Baseline 8/5: 66.25%    Time 12    Period Weeks    Status New    Target Date 02/17/21      PT LONG TERM GOAL #3   Title Patient will increase FOTO score to equal to or greater than 70 to demonstrate improvement in mobility and quality of life.    Baseline 8/5: 59    Time 12    Period Weeks    Status New    Target Date 02/17/21      PT LONG TERM GOAL #4   Title Patient will increase Berg Balance score by > 6 points to demonstrate decreased fall risk during functional activities.     Baseline 8/5: to be completed future session    Time 12    Period Weeks    Status New    Target Date 02/17/21      PT LONG TERM GOAL #5   Title Patient will tolerate 5 seconds of single leg stance without loss of balance to improve ability to get in and out of shower safely.    Baseline 8/5: Pt maintains 1-3 sec of SLB BLEs, reports she has fallen getting in/out of shower    Time 12    Period Weeks    Status New    Target Date 02/17/21                   Plan - 01/11/21 1735     Clinical Impression Statement Session somewhat limited due to patient late arrival.  Continued focus on VOR training on compliant surface, patient was able to progress to eyes closed with head turns on compliant surface.  Horizontal head turns continue to be the most challenging for the patient where she exhibits frequent posterior loss of balance with reported decreased awareness in loss of balance.  PT provides contact-guard assist to min assist throughout session to help patient regain balance.  The patient will benefit from further skilled PT services to improve balance to decrease fall risk.    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    Examination-Activity Limitations Bathing;Bend;Dressing;Stairs;Locomotion Level    Examination-Participation Restrictions Community Activity;Laundry;Yard Work;Cleaning;Driving;Meal Prep;Shop    Stability/Clinical Decision Making Evolving/Moderate complexity    Rehab Potential Good    PT Frequency 2x / week    PT Duration 12 weeks  PT Treatment/Interventions ADLs/Self  Care Home Management;Canalith Repostioning;Cryotherapy;Electrical Stimulation;Moist Heat;Ultrasound;DME Instruction;Gait training;Stair training;Functional mobility training;Therapeutic activities;Therapeutic exercise;Balance training;Neuromuscular re-education;Patient/family education;Manual techniques;Orthotic Fit/Training;Passive range of motion;Dry needling;Energy conservation;Splinting;Taping;Vestibular;Visual/perceptual remediation/compensation;Joint Manipulations    PT Next Visit Plan cervical mobility, stretching, and balance, continue POC as previously indicated    PT Home Exercise Plan Seated VORx1 with vertical head turns, daily, in 30 second bouts for a total of 3 minutes; no updates 12/12/20; 8/24:  Access Code: [email or web address]    Consulted and Agree with Plan of Care Patient             Patient will benefit from skilled therapeutic intervention in order to improve the following deficits and impairments:  Abnormal gait, Decreased activity tolerance, Decreased range of motion, Decreased knowledge of use of DME, Decreased strength, Dizziness, Improper body mechanics, Decreased balance, Decreased mobility, Difficulty walking, Increased muscle spasms, Impaired flexibility, Postural dysfunction, Hypomobility  Visit Diagnosis: Unsteadiness on feet  Other abnormalities of gait and mobility  Other lack of coordination     Problem List There are no problems to display for this patient.   Baird Kay, PT 01/11/2021, 5:38 PM  [facility] MAIN [facility] SERVICES [address] [place], [place], [address] Phone: [phone]   Fax:  [phone]  Name: Desiree Ramirez MRN: [id number] Date of Birth: November 14, 1950

## 2021-01-16 ENCOUNTER — Ambulatory Visit: Payer: Medicare Other

## 2021-01-16 ENCOUNTER — Other Ambulatory Visit: Payer: Self-pay

## 2021-01-16 DIAGNOSIS — R2681 Unsteadiness on feet: Secondary | ICD-10-CM

## 2021-01-16 DIAGNOSIS — R2689 Other abnormalities of gait and mobility: Secondary | ICD-10-CM

## 2021-01-16 NOTE — Therapy (Signed)
[place] [facility] MAIN [facility] SERVICES [address] [place], [place], [address] Phone: [phone]   Fax:  [phone]  Physical Therapy Treatment  Patient Details  Name: Desiree Ramirez MRN: [id number] Date of Birth: 1950-10-19 Referring Provider (PT): Linus Salmons, MD   Encounter Date: 01/16/2021   PT End of Session - 01/16/21 1658     Visit Number 9    Number of Visits 25    Date for PT Re-Evaluation 02/17/21    Authorization Type Medicare [employer] A[employer]    Authorization Time Period 11/25/20-02/17/21    PT Start Time 1526    PT Stop Time 1600    PT Time Calculation (min) 34 min    Equipment Utilized During Treatment Gait belt    Activity Tolerance Patient tolerated treatment well    Behavior During Therapy [facility] for tasks assessed/performed             Past Medical History:  Diagnosis Date   Allergic rhinitis    with eczema   Anemia    Asthma    without status asthmaticus; with allergic component   Colon polyps    Depression    Diabetes mellitus without complication (HCC)    History of carpal tunnel syndrome    bilateral   History of chest pain    negative stress test 05/2004   Hyperlipidemia    Hypertension    Idiopathic peripheral neuropathy    Microscopic hematuria    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 Caryn Section   Osteoarthritis    with history of whiplash   Sleep apnea    AHI 43.6, CPAP of 11 initiated   Vitamin D deficiency     Past Surgical History:  Procedure Laterality Date   ABDOMINAL HYSTERECTOMY     TAH/BSO with uterine enlargement, fibroids   CARPAL TUNNEL RELEASE Bilateral    CATARACT EXTRACTION Bilateral    COLONOSCOPY  10/16/2005   adenomatous polyp   COLONOSCOPY WITH PROPOFOL N/A 05/27/2015   Procedure: COLONOSCOPY WITH PROPOFOL;  Surgeon: Scot Jun, MD;  Location: [facility] ENDOSCOPY;  Service: Endoscopy;  Laterality: N/A;    ESOPHAGOGASTRODUODENOSCOPY (EGD) WITH PROPOFOL N/A 05/27/2015   Procedure: ESOPHAGOGASTRODUODENOSCOPY (EGD) WITH PROPOFOL;  Surgeon: Scot Jun, MD;  Location: [facility] ENDOSCOPY;  Service: Endoscopy;  Laterality: N/A;   INGUINAL HERNIA REPAIR Right 06/17/2015   Procedure: HERNIA REPAIR INGUINAL ADULT;  Surgeon: Nadeen Landau, MD;  Location: [facility] ORS;  Service: General;  Laterality: Right;    There were no vitals filed for this visit.     Subjective Assessment - 01/16/21 1526     Subjective Pt denies any stumbles or falls. Pt reports she did feel off when trying to walk around something in her kitchen.    Pertinent History Pt presents to PT ambulating without an AD. Pt reports her main problem is feelings of imbalance, not dizziness. She reports onset of imbalance started July 4th 2021, where she was preparing to go on vacation and noticed she was walking toward her L side. She says her balance with walking continued to worsen but has since "leveled out." She has fallen 2x in the last six months, denies hitting her head with falls. She feels most off balance with looking over her shoulder, bending/looking down, and sometimes with looking up. She feels she is still moving when she is walking and tries to stop and says, "  I have to get my footing." She denies any spinning sensation, although she reports she did recently get treated at ENT for "crystals in my ear," a few months ago and that it helped. She has also recently had hearing tested and reports she is unable to hear higher pitches. She denies aural fullness, drainage, but does report ringing in her ears. She denies any feelings of light-headedness. Pt reports no headaches or sensitivity to light. She reports sometimes she does feel nauseated but that she has "stomach problems" and recently had diverticulitis. Pt has history of UTIs and was recently treated for one "a week ago," but reports no improvements in her balance/unsteadiness. Other PMH  per chart includes anemia, ashtma, DM, B carpal tunnel syndrome, hx of chest pain (negative stress test 2006), hx stomach ulcers, HLD, HTN, idiopathic peripheral neuropathy, migraine, mood disorder, osteoarthritis w history of whiplash, sleep apnea, vitamin d deficiency, depression.    Currently in Pain? No/denies            INTERVENTIONS  Seated: Cervical rotation - 2x30 sec B Upper trap stretch - 60 sec B Cervical flexors stretch - 60 sec Cervical extensors stretch initiated with chin tuck - 60 sec  Ambulation in hallway reading sticky notes on wall to incorporate dual task with horizontal and vertical head turns - 4x length of hallway Ambulation in hallway with horizontal head turns - 2x length of hallway Ambulation in hallway with vertical head turns - 2x length of hallway  Comments: Pt reports increased difficulty with vertical head turns, shows decreased postural stability. Requires CGA throughout.  Ambulation with EC 2x length of hallway. CGA provided. Pt veers to R but is unable to tell per report.   At support surface standing on airex, with CGA-min a: EC, NBOS, 2x60 sec; frequent use of UE support to regain balance EC, NBOS with dual task of naming foods - x 60 sec EC, NBOS with dual task of naming animals x 60 sec Comments: overall improvement with reps, relies less on support with dual task EC with horizontal head turns 2x10 each direction EO horizontal head turns 10x each direction EC vertical head turns 2x10 each direction EO vertical head turns 10x each directoin Comments: challenging with EC  SLB 2x30 sec; can maintain balance 10 sec with intermittent UE support   Pt educated throughout session about proper posture and technique with exercises. Improved exercise technique, movement at target joints, use of target muscles after min to mod verbal, visual, tactile cues.   Note: Portions of this document were prepared using Dragon voice recognition software and  although reviewed may contain unintentional dictation errors in syntax, grammar, or spelling.   PT Education - 01/16/21 1657     Education Details exercise technique, body mechanics    Person(s) Educated Patient    Methods Explanation;Demonstration;Verbal cues    Comprehension Verbalized understanding;Returned demonstration;Need further instruction              PT Short Term Goals - 11/25/20 1036       PT SHORT TERM GOAL #1   Title Patient will be independent in home exercise program to improve strength/mobility and balance for increased safety with ADLs.    Baseline 8/5: to be initiated    Time 6    Period Weeks    Status New    Target Date 01/06/21               PT Long Term Goals - 11/25/20 1037  PT LONG TERM GOAL #1   Title Patient will reduce dizziness handicap inventory score by at least 13 points for less dizziness with ADLs and increased safety with home and work tasks.    Baseline 8/5: 28%    Time 12    Period Weeks    Status New    Target Date 02/17/21      PT LONG TERM GOAL #2   Title Patient will increase ABC scale score >80% to demonstrate better functional mobility and better confidence with ADLs.    Baseline 8/5: 66.25%    Time 12    Period Weeks    Status New    Target Date 02/17/21      PT LONG TERM GOAL #3   Title Patient will increase FOTO score to equal to or greater than 70 to demonstrate improvement in mobility and quality of life.    Baseline 8/5: 59    Time 12    Period Weeks    Status New    Target Date 02/17/21      PT LONG TERM GOAL #4   Title Patient will increase Berg Balance score by > 6 points to demonstrate decreased fall risk during functional activities.    Baseline 8/5: to be completed future session    Time 12    Period Weeks    Status New    Target Date 02/17/21      PT LONG TERM GOAL #5   Title Patient will tolerate 5 seconds of single leg stance without loss of balance to improve ability to get in and out  of shower safely.    Baseline 8/5: Pt maintains 1-3 sec of SLB BLEs, reports she has fallen getting in/out of shower    Time 12    Period Weeks    Status New    Target Date 02/17/21                   Plan - 01/16/21 1658     Clinical Impression Statement Patient was progressed on balance training with introduction of dual task with eyes closed on compliant surface.  Patient postural stability actually improved with addition of dual task, however, this was possibly due to improvement with repetition.  Patient performed ambulation with head turns and with eyes closed in hallway, where patient had greatest difficulty with vertical head turns. With eyes closed pt would veer to right, but reports she was unable to tell when this occured.  Patient will benefit from further skilled PT services to improve balance and decrease fall risk.    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    Examination-Activity Limitations Bathing;Bend;Dressing;Stairs;Locomotion Level    Examination-Participation Restrictions Community Activity;Laundry;Yard Work;Cleaning;Driving;Meal Prep;Shop    Stability/Clinical Decision Making Evolving/Moderate complexity    Rehab Potential Good    PT Frequency 2x / week    PT Duration 12 weeks    PT Treatment/Interventions ADLs/Self Care Home Management;Canalith Repostioning;Cryotherapy;Electrical Stimulation;Moist Heat;Ultrasound;DME Instruction;Gait training;Stair training;Functional mobility training;Therapeutic activities;Therapeutic exercise;Balance training;Neuromuscular  re-education;Patient/family education;Manual techniques;Orthotic Fit/Training;Passive range of motion;Dry needling;Energy conservation;Splinting;Taping;Vestibular;Visual/perceptual remediation/compensation;Joint Manipulations    PT Next Visit Plan cervical mobility, stretching, and balance, dual task training and continue POC as previously indicated  PT Home Exercise Plan Seated VORx1 with vertical head turns, daily, in 30 second bouts for a total of 3 minutes; no updates 12/12/20; 8/24:  Access Code: [email or web address]    Consulted and Agree with Plan of Care Patient             Patient will benefit from skilled therapeutic intervention in order to improve the following deficits and impairments:  Abnormal gait, Decreased activity tolerance, Decreased range of motion, Decreased knowledge of use of DME, Decreased strength, Dizziness, Improper body mechanics, Decreased balance, Decreased mobility, Difficulty walking, Increased muscle spasms, Impaired flexibility, Postural dysfunction, Hypomobility  Visit Diagnosis: Unsteadiness on feet  Other abnormalities of gait and mobility     Problem List There are no problems to display for this patient.   Baird Kay, PT 01/16/2021, 5:08 PM  [place] [facility] MAIN [facility] SERVICES [address] [place], [place], [address] Phone: [phone]   Fax:  [phone]  Name: Desiree Ramirez MRN: [id number] Date of Birth: [birth year]

## 2021-01-18 ENCOUNTER — Ambulatory Visit: Payer: Medicare Other

## 2021-01-18 ENCOUNTER — Other Ambulatory Visit (HOSPITAL_COMMUNITY): Payer: Self-pay | Admitting: Neurology

## 2021-01-18 ENCOUNTER — Other Ambulatory Visit: Payer: Self-pay | Admitting: Neurology

## 2021-01-18 DIAGNOSIS — R413 Other amnesia: Secondary | ICD-10-CM

## 2021-01-25 ENCOUNTER — Ambulatory Visit: Payer: Medicare Other

## 2021-01-25 ENCOUNTER — Other Ambulatory Visit: Payer: Self-pay

## 2021-01-25 ENCOUNTER — Ambulatory Visit (HOSPITAL_COMMUNITY)
Admission: RE | Admit: 2021-01-25 | Discharge: 2021-01-25 | Disposition: A | Discharge status: Home / Self Care | Payer: Medicare Other | Source: Ambulatory Visit | Attending: Neurology | Admitting: Neurology

## 2021-01-25 DIAGNOSIS — R413 Other amnesia: Secondary | ICD-10-CM | POA: Diagnosis present

## 2021-01-25 MED ORDER — GADOBUTROL 1 MMOL/ML IV SOLN
8.0000 mL | Freq: Once | INTRAVENOUS | Status: AC | PRN
  Administered 2021-01-25: 8 mL via INTRAVENOUS

## 2021-02-01 ENCOUNTER — Ambulatory Visit: Payer: Medicare Other

## 2021-02-06 ENCOUNTER — Other Ambulatory Visit: Payer: Self-pay

## 2021-02-06 ENCOUNTER — Ambulatory Visit: Payer: Medicare Other | Attending: Unknown Physician Specialty

## 2021-02-06 DIAGNOSIS — R2681 Unsteadiness on feet: Secondary | ICD-10-CM | POA: Diagnosis not present

## 2021-02-06 DIAGNOSIS — M6281 Muscle weakness (generalized): Secondary | ICD-10-CM | POA: Diagnosis present

## 2021-02-06 DIAGNOSIS — R2689 Other abnormalities of gait and mobility: Secondary | ICD-10-CM | POA: Diagnosis present

## 2021-02-06 DIAGNOSIS — R278 Other lack of coordination: Secondary | ICD-10-CM | POA: Diagnosis present

## 2021-02-06 NOTE — Therapy (Signed)
[place] MAIN [facility] SERVICES [address] [place], [place], [address] Phone: [phone]   Fax:  [phone]  Physical Therapy Treatment Physical Therapy Progress Note   Dates of reporting period  11/25/20   to   02/06/21   Patient Details  Name: Desiree Ramirez MRN: [id number] Date of Birth: [birth year] Referring Provider (PT): Beverly Gust, MD   Encounter Date: 02/06/2021   PT End of Session - 02/06/21 1239     Visit Number 10    Number of Visits 25    Date for PT Re-Evaluation 02/17/21    Authorization Type Medicare Parts A[employer]B    Authorization Time Period 11/25/20-02/17/21    Progress Note Due on Visit 20    PT Start Time 1151    PT Stop Time 1230    PT Time Calculation (min) 39 min    Activity Tolerance Patient tolerated treatment well;No increased pain    Behavior During Therapy WFL for tasks assessed/performed             Past Medical History:  Diagnosis Date   Allergic rhinitis    with eczema   Anemia    Asthma    without status asthmaticus; with allergic component   Colon polyps    Depression    Diabetes mellitus without complication ([place])    History of carpal tunnel syndrome    bilateral   History of chest pain    negative stress test 05/2004   Hyperlipidemia    Hypertension    Idiopathic peripheral neuropathy    Microscopic hematuria    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 Cephus Shelling   Osteoarthritis    with history of whiplash   Sleep apnea    AHI 43.6, CPAP of 11 initiated   Vitamin D deficiency     Past Surgical History:  Procedure Laterality Date   ABDOMINAL HYSTERECTOMY     TAH/BSO with uterine enlargement, fibroids   CARPAL TUNNEL RELEASE Bilateral    CATARACT EXTRACTION Bilateral    COLONOSCOPY  10/16/2005   adenomatous polyp   COLONOSCOPY WITH PROPOFOL N/A 05/27/2015   Procedure: COLONOSCOPY WITH PROPOFOL;  Surgeon:  Manya Silvas, MD;  Location: [facility] ENDOSCOPY;  Service: Endoscopy;  Laterality: N/A;   ESOPHAGOGASTRODUODENOSCOPY (EGD) WITH PROPOFOL N/A 05/27/2015   Procedure: ESOPHAGOGASTRODUODENOSCOPY (EGD) WITH PROPOFOL;  Surgeon: Manya Silvas, MD;  Location: [facility] ENDOSCOPY;  Service: Endoscopy;  Laterality: N/A;   INGUINAL HERNIA REPAIR Right 06/17/2015   Procedure: HERNIA REPAIR INGUINAL ADULT;  Surgeon: Leonie Green, MD;  Location: [facility] ORS;  Service: General;  Laterality: Right;    There were no vitals filed for this visit.   Subjective Assessment - 02/06/21 1155     Subjective Pt reports doing well in general. She has not been here in about two weeks, was out of town for a wedding. Pt recently saw neurologist and had MRI of brain, no clear results at this point per patient. Pt has not had any dizizness issues. Pt has not been working on her HEP due to being so busy. Pt reports balances remains a little off.    Pertinent History Pt presents to PT ambulating without an AD. Pt reports her main problem is feelings of imbalance, not dizziness. She reports onset of imbalance started July 4th 2021, where she was preparing to go on vacation and noticed she was walking toward her  L side. She says her balance with walking continued to worsen but has since "leveled out." She has fallen 2x in the last six months, denies hitting her head with falls. She feels most off balance with looking over her shoulder, bending/looking down, and sometimes with looking up. She feels she is still moving when she is walking and tries to stop and says, "I have to get my footing." She denies any spinning sensation, although she reports she did recently get treated at ENT for "crystals in my ear," a few months ago and that it helped. She has also recently had hearing tested and reports she is unable to hear higher pitches. She denies aural fullness, drainage, but does report ringing in her ears. She denies any feelings of  light-headedness. Pt reports no headaches or sensitivity to light. She reports sometimes she does feel nauseated but that she has "stomach problems" and recently had diverticulitis. Pt has history of UTIs and was recently treated for one "a week ago," but reports no improvements in her balance/unsteadiness. Other PMH per chart includes anemia, ashtma, DM, B carpal tunnel syndrome, hx of chest pain (negative stress test 2006), hx stomach ulcers, HLD, HTN, idiopathic peripheral neuropathy, migraine, mood disorder, osteoarthritis w history of whiplash, sleep apnea, vitamin d deficiency, depression.    How long can you sit comfortably? unlimited    How long can you stand comfortably? unlimited    How long can you walk comfortably? unlimited    Currently in Pain? No/denies                [facility] PT Assessment - 02/06/21 0001       Balance Screen   Has the patient fallen in the past 6 months Yes    How many times? 3    Has the patient had a decrease in activity level because of a fear of falling?  No    Is the patient reluctant to leave their home because of a fear of falling?  No      Observation/Other Assessments   Focus on Therapeutic Outcomes (FOTO)  62   59 at eval     Standardized Balance Assessment   Standardized Balance Assessment Berg Balance Test      Berg Balance Test   Sit to Stand Able to stand without using hands and stabilize independently    Standing Unsupported Able to stand safely 2 minutes    Sitting with Back Unsupported but Feet Supported on Floor or Stool Able to sit safely and securely 2 minutes    Stand to Sit Sits safely with minimal use of hands    Transfers Able to transfer safely, minor use of hands    Standing Unsupported with Eyes Closed Able to stand 10 seconds safely    Standing Unsupported with Feet Together Able to place feet together independently and stand for 1 minute with supervision    From Standing, Reach Forward with Outstretched Arm Can reach  confidently >25 cm (10")    From Standing Position, Pick up Object from Floor Able to pick up shoe safely and easily    From Standing Position, Turn to Look Behind Over each Shoulder Looks behind from both sides and weight shifts well    Turn 360 Degrees Needs close supervision or verbal cueing   LOB upon stopping   Standing Unsupported, Alternately Place Feet on Step/Stool Able to stand independently and safely and complete 8 steps in 20 seconds    Standing Unsupported, One Foot  in [employer] balance while stepping or standing    Standing on One Leg Unable to try or needs assist to prevent fall    Total Score 44    Berg comment: significant step-righting and anticipatory adjusment deficits not captures in this assessment              DHI: 12/100 ABC Scale: 77.5% SLS balance: less than 5sec bilat, requires minGuard assist for safety         PT Education - 02/06/21 1158     Education Details results of reassessment    Person(s) Educated Patient    Methods Explanation;Demonstration    Comprehension Verbalized understanding;Returned demonstration              PT Short Term Goals - 02/06/21 1201       PT SHORT TERM GOAL #1   Title Patient will be independent in home exercise program to improve strength/mobility and balance for increased safety with ADLs.    Baseline 8/5: to be initiated    Time 6    Period Weeks    Status On-going    Target Date 01/06/21               PT Long Term Goals - 02/06/21 1201       PT LONG TERM GOAL #1   Title Patient will reduce dizziness handicap inventory score by at least 13 points for less dizziness with ADLs and increased safety with home and work tasks.    Baseline 8/5: 28; 10/17: 12 (pt reports that she doesn't have dizziness issues)    Time 12    Period Weeks    Status Achieved    Target Date 02/17/21      PT LONG TERM GOAL #2   Title Patient will increase ABC scale score >80% to demonstrate better functional  mobility and better confidence with ADLs.    Baseline 8/5: 66.25%; 10/17: 77.5%    Time 12    Period Weeks    Status On-going    Target Date 02/17/21      PT LONG TERM GOAL #3   Title Patient will increase FOTO score to equal to or greater than 70 to demonstrate improvement in mobility and quality of life.    Baseline 8/5: 59; 10/17: 62    Time 12    Period Weeks    Status On-going    Target Date 02/17/21      PT LONG TERM GOAL #4   Title Patient will increase Berg Balance score by > 6 points to demonstrate decreased fall risk during functional activities.    Baseline 8/5: to be completed future session; 02/06/21: 44/56    Time 12    Period Weeks    Status On-going    Target Date 02/17/21      PT LONG TERM GOAL #5   Title Patient will tolerate 5 seconds of single leg stance without loss of balance to improve ability to get in and out of shower safely.    Baseline 8/5: Pt maintains 1-3 sec of SLB BLEs; 02/07/24: <5sec c trunk ataxia    Time 12    Period Weeks    Target Date 02/17/21                   Plan - 02/06/21 1240     Clinical Impression Statement Reassesment performed this date on visit 10. Pt has had 2 disruptions of services, once for a viral infection and later  for personal travel. FOTO survey score is unchanged statistically, DHI shows excellent improvement and has met goal, ABC scale is improved by 11% but has not met goal. Performance of SLS balance as well as Berg Balance Test score are both essentially unchanged compared to evaluation. Pt admittedly has not been especially compliant with HEP performance as of late. Righting reponses durng LOB or disequilibrium remain hyperactive, uncoordinated, and largely unsuccessful, requiring UE use to achieve balance recovery. The patient's therapy prognosis indicates continued potential for improvement, anticipate that future progress is attainable in a reasonable/predictable timeframe. Maximum improvement is within  reach. Pt will continue to benefit from skilled PT services to address deficits and impairment identified in evaluation in order to maximize independence and safety in basic mobility required for performance of ADL, IADL, and leisure.    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    Examination-Activity Limitations Bed Mobility    Examination-Participation Restrictions Community Activity;Laundry;Yard Work;Cleaning;Driving;Meal Prep;Shop    Stability/Clinical Decision Making Evolving/Moderate complexity    Clinical Decision Making Moderate    Rehab Potential Good    PT Frequency 2x / week    PT Duration 12 weeks    PT Treatment/Interventions ADLs/Self Care Home Management;Canalith Repostioning;Cryotherapy;Electrical Stimulation;Moist Heat;Ultrasound;DME Instruction;Gait training;Stair training;Functional mobility training;Therapeutic activities;Therapeutic exercise;Balance training;Neuromuscular re-education;Patient/family education;Manual techniques;Orthotic Fit/Training;Passive range of motion;Dry needling;Energy conservation;Splinting;Taping;Vestibular;Visual/perceptual remediation/compensation;Joint Manipulations    PT Next Visit Plan FU on HEP compliance; high volume practice of single limb and tandem stance static postural control training    PT Home Exercise Plan Seated VORx1 with vertical head turns, daily, in 30 second bouts for a total of 3 minutes; no updates 12/12/20; 8/24:  Access Code: [email or web address]    Consulted and Agree with Plan of Care Patient             Patient will benefit from skilled  therapeutic intervention in order to improve the following deficits and impairments:  Abnormal gait, Decreased activity tolerance, Decreased range of motion, Decreased knowledge of use of DME, Decreased strength, Dizziness, Improper body mechanics, Decreased balance, Decreased mobility, Difficulty walking, Increased muscle spasms, Impaired flexibility, Postural dysfunction, Hypomobility  Visit Diagnosis: Unsteadiness on feet  Other abnormalities of gait and mobility  Other lack of coordination  Muscle weakness (generalized)     Problem List There are no problems to display for this patient.  12:50 PM, 02/06/21 Etta Grandchild, PT, DPT Physical Therapist - [facility]  [phone])    [place], [place] 02/06/2021, 12:47 PM  [place] MAIN [facility] SERVICES [address] [place], [place], [address] Phone: [phone]   Fax:  [phone]  Name: Desiree Ramirez MRN: [id number] Date of Birth: 1951/01/08

## 2021-02-08 ENCOUNTER — Ambulatory Visit: Payer: Medicare Other

## 2021-02-08 ENCOUNTER — Other Ambulatory Visit: Payer: Self-pay

## 2021-02-08 DIAGNOSIS — R278 Other lack of coordination: Secondary | ICD-10-CM

## 2021-02-08 DIAGNOSIS — M6281 Muscle weakness (generalized): Secondary | ICD-10-CM

## 2021-02-08 DIAGNOSIS — R2681 Unsteadiness on feet: Secondary | ICD-10-CM | POA: Diagnosis not present

## 2021-02-08 DIAGNOSIS — R2689 Other abnormalities of gait and mobility: Secondary | ICD-10-CM

## 2021-02-08 NOTE — Therapy (Signed)
[place] [facility] MAIN [facility] SERVICES [address] [place], [place], [address] Phone: [phone]   Fax:  [phone]  Physical Therapy Treatment  Patient Details  Name: Desiree Ramirez MRN: [id number] Date of Birth: 12-Dec-1950 Referring Provider (PT): Linus Salmons, MD   Encounter Date: 02/08/2021   PT End of Session - 02/08/21 1442     Visit Number 11    Number of Visits 25    Date for PT Re-Evaluation 02/17/21    Authorization Type Medicare [employer]    Authorization Time Period 11/25/20-02/17/21    PT Start Time 1436   pt arrived late   PT Stop Time 1506    PT Time Calculation (min) 30 min    Equipment Utilized During Treatment Gait belt    Activity Tolerance Patient tolerated treatment well;No increased pain    Behavior During Therapy WFL for tasks assessed/performed             Past Medical History:  Diagnosis Date   Allergic rhinitis    with eczema   Anemia    Asthma    without status asthmaticus; with allergic component   Colon polyps    Depression    Diabetes mellitus without complication (HCC)    History of carpal tunnel syndrome    bilateral   History of chest pain    negative stress test 05/2004   Hyperlipidemia    Hypertension    Idiopathic peripheral neuropathy    Microscopic hematuria    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 Caryn Section   Osteoarthritis    with history of whiplash   Sleep apnea    AHI 43.6, CPAP of 11 initiated   Vitamin D deficiency     Past Surgical History:  Procedure Laterality Date   ABDOMINAL HYSTERECTOMY     TAH/BSO with uterine enlargement, fibroids   CARPAL TUNNEL RELEASE Bilateral    CATARACT EXTRACTION Bilateral    COLONOSCOPY  10/16/2005   adenomatous polyp   COLONOSCOPY WITH PROPOFOL N/A 05/27/2015   Procedure: COLONOSCOPY WITH PROPOFOL;  Surgeon: Scot Jun, MD;  Location: [facility] ENDOSCOPY;  Service:  Endoscopy;  Laterality: N/A;   ESOPHAGOGASTRODUODENOSCOPY (EGD) WITH PROPOFOL N/A 05/27/2015   Procedure: ESOPHAGOGASTRODUODENOSCOPY (EGD) WITH PROPOFOL;  Surgeon: Scot Jun, MD;  Location: [facility] ENDOSCOPY;  Service: Endoscopy;  Laterality: N/A;   INGUINAL HERNIA REPAIR Right 06/17/2015   Procedure: HERNIA REPAIR INGUINAL ADULT;  Surgeon: Nadeen Landau, MD;  Location: [facility] ORS;  Service: General;  Laterality: Right;    There were no vitals filed for this visit.   Subjective Assessment - 02/08/21 1440     Subjective Pt reports doing ok. She did not have a chance to restart HEP since prior session. Today she has no pain. She denies any recent falls, dizziness, close calls, any pain. Pt has not had any MD visits recently.    Pertinent History Pt presents to PT ambulating without an AD. Pt reports her main problem is feelings of imbalance, not dizziness. She reports onset of imbalance started July 4th 2021, where she was preparing to go on vacation and noticed she was walking toward her L side. She says her balance with walking continued to worsen but has since "leveled out." She has fallen 2x in the last six months, denies hitting her head with falls. She feels most off balance with looking over her shoulder, bending/looking  down, and sometimes with looking up. She feels she is still moving when she is walking and tries to stop and says, "I have to get my footing." She denies any spinning sensation, although she reports she did recently get treated at ENT for "crystals in my ear," a few months ago and that it helped. She has also recently had hearing tested and reports she is unable to hear higher pitches. She denies aural fullness, drainage, but does report ringing in her ears. She denies any feelings of light-headedness. Pt reports no headaches or sensitivity to light. She reports sometimes she does feel nauseated but that she has "stomach problems" and recently had diverticulitis. Pt has history  of UTIs and was recently treated for one "a week ago," but reports no improvements in her balance/unsteadiness. Other PMH per chart includes anemia, ashtma, DM, B carpal tunnel syndrome, hx of chest pain (negative stress test 2006), hx stomach ulcers, HLD, HTN, idiopathic peripheral neuropathy, migraine, mood disorder, osteoarthritis w history of whiplash, sleep apnea, vitamin d deficiency, depression.    Currently in Pain? No/denies              INTERVENTION THIS DATE:  -orthostratic vitals assessment, asymptomatic  -123/74 76bpm seated -98/63 86bpm seated -104/69 87 standing x 2 minutes   -116/74  s/p 634ft AMB (taken in standing) asymptomatic   -SLS 2x30s bilat; LOB Q3-5sec with modI recovery requiring hands each time -Rocker board x20 BUE support -Alternating normal stance in DF or PF 10x10sec each -Lateral rocker board x20, BUE supported -lateral rocker board balance 5x10sechold each (dificulty with following commands, requires instruction >5x)  -normal stance eyes closed on foam x90-sec  *pt arrives with electrolyte drink which she says is part of her daily ritual (typically 2 daily). Author notes ingredients to include ~550mg  potassium or 1100mg  daily. Unclear if this is contraindicated by any of her medications or if related to any of her baseline symptoms.   The patient's therapy prognosis indicates continued potential for improvement, anticipate that future progress is attainable in a reasonable/predictable timeframe. Maximum improvement is within reach. Pt will continue to benefit from skilled PT services to address deficits and impairment identified in evaluation in order to maximize independence and safety in basic mobility required for performance of ADL, IADL, and leisure.        PT Education - 02/08/21 1441     Education Details conitnued focus on repeating HEP at home.    Person(s) Educated Patient    Methods Explanation;Demonstration    Comprehension  Verbalized understanding;Returned demonstration              PT Short Term Goals - 02/06/21 1201       PT SHORT TERM GOAL #1   Title Patient will be independent in home exercise program to improve strength/mobility and balance for increased safety with ADLs.    Baseline 8/5: to be initiated    Time 6    Period Weeks    Status On-going    Target Date 01/06/21               PT Long Term Goals - 02/06/21 1201       PT LONG TERM GOAL #1   Title Patient will reduce dizziness handicap inventory score by at least 13 points for less dizziness with ADLs and increased safety with home and work tasks.    Baseline 8/5: 28; 10/17: 12 (pt reports that she doesn't have dizziness issues)    Time 12  Period Weeks    Status Achieved    Target Date 02/17/21      PT LONG TERM GOAL #2   Title Patient will increase ABC scale score >80% to demonstrate better functional mobility and better confidence with ADLs.    Baseline 8/5: 66.25%; 10/17: 77.5%    Time 12    Period Weeks    Status On-going    Target Date 02/17/21      PT LONG TERM GOAL #3   Title Patient will increase FOTO score to equal to or greater than 70 to demonstrate improvement in mobility and quality of life.    Baseline 8/5: 59; 10/17: 62    Time 12    Period Weeks    Status On-going    Target Date 02/17/21      PT LONG TERM GOAL #4   Title Patient will increase Berg Balance score by > 6 points to demonstrate decreased fall risk during functional activities.    Baseline 8/5: to be completed future session; 02/06/21: 44/56    Time 12    Period Weeks    Status On-going    Target Date 02/17/21      PT LONG TERM GOAL #5   Title Patient will tolerate 5 seconds of single leg stance without loss of balance to improve ability to get in and out of shower safely.    Baseline 8/5: Pt maintains 1-3 sec of SLB BLEs; 02/07/24: <5sec c trunk ataxia    Time 12    Period Weeks    Target Date 02/17/21                    Plan - 02/08/21 1454     Clinical Impression Statement Continued with current plan of care as laid out in evaluation and recent prior sessions. Pt noted to be orthostatic at beginning of session with very slow gradual recovery, not returning to seated value despite 615ft AMB. Pt has no symptoms associated which may speak to chronicity of this phenomenon. All interventions tolerated as planned. Interventions aimed at targeting static and dynamic postural control, anticipatory postural control, multilevel righting strategies, single and multiplanar movements, initiation/cessation of mobility, and full body proprioception. Also incorporated components for maximizing pt's vestibular and visual systems for full functional reintegration. Recovery intervals given as needed based on signs of exertion and/or pt request. Pt remains motivated to advance progress toward goals in order to maximize independence and safety at home. Pt requires high level assistance and cuing for completion of exercises in order to provide adequate level of stimulation and perturbation. Author allows pt as much opportunity as possible to perform independent righting strategies, only providing physical assist when pt is unable correct LOB independently.     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    Examination-Activity Limitations Bed Mobility    Examination-Participation Restrictions Community Activity;Laundry;Yard Work;Cleaning;Driving;Meal Prep;Shop    Stability/Clinical Decision Making Evolving/Moderate complexity     Clinical Decision Making Moderate    Rehab Potential Good    PT Frequency 2x / week    PT Duration 12 weeks    PT Treatment/Interventions ADLs/Self Care Home Management;Canalith Repostioning;Cryotherapy;Electrical Stimulation;Moist Heat;Ultrasound;DME Instruction;Gait training;Stair training;Functional mobility  training;Therapeutic activities;Therapeutic exercise;Balance training;Neuromuscular re-education;Patient/family education;Manual techniques;Orthotic Fit/Training;Passive range of motion;Dry needling;Energy conservation;Splinting;Taping;Vestibular;Visual/perceptual remediation/compensation;Joint Manipulations    PT Next Visit Plan FU on HEP compliance; high volume practice of single limb and tandem stance static postural control training    PT Home Exercise Plan Seated VORx1 with vertical head turns, daily, in 30 second bouts for a total of 3 minutes; no updates 12/12/20; 8/24:  Access Code: [email or web address]    Consulted and Agree with Plan of Care Patient             Patient will benefit from skilled therapeutic intervention in order to improve the following deficits and impairments:  Abnormal gait, Decreased activity tolerance, Decreased range of motion, Decreased knowledge of use of DME, Decreased strength, Dizziness, Improper body mechanics, Decreased balance, Decreased mobility, Difficulty walking, Increased muscle spasms, Impaired flexibility, Postural dysfunction, Hypomobility  Visit Diagnosis: Unsteadiness on feet  Other abnormalities of gait and mobility  Other lack of coordination  Muscle weakness (generalized)     Problem List There are no problems to display for this patient.  3:14 PM, 02/08/21 Rosamaria Lints, PT, DPT Physical Therapist - [facility]  Outpatient Physical Therapy- Main Campus [phone]     Bruno, [place] 02/08/2021, 3:01 PM  [place] [facility] MAIN [facility] SERVICES [address]  [place], [place], [address] Phone: [phone]   Fax:  [phone]  Name: JALONDA ANTIGUA MRN: [id number] Date of Birth: May 07, 1950

## 2021-02-14 ENCOUNTER — Ambulatory Visit: Payer: Medicare Other

## 2021-02-16 ENCOUNTER — Ambulatory Visit: Payer: Medicare Other

## 2021-02-20 ENCOUNTER — Other Ambulatory Visit: Payer: Self-pay

## 2021-02-20 ENCOUNTER — Ambulatory Visit: Payer: Medicare Other

## 2021-02-20 DIAGNOSIS — R278 Other lack of coordination: Secondary | ICD-10-CM

## 2021-02-20 DIAGNOSIS — R2681 Unsteadiness on feet: Secondary | ICD-10-CM

## 2021-02-20 DIAGNOSIS — R2689 Other abnormalities of gait and mobility: Secondary | ICD-10-CM

## 2021-02-20 NOTE — Therapy (Addendum)
[place] MAIN [facility] SERVICES [address] [place], [place], [address] Phone: [phone]   Fax:  [phone]  Physical Therapy Treatment/RECERT  Patient Details  Name: Desiree Ramirez MRN: [id number] Date of Birth: Apr 15, 1951 Referring Provider (PT): Beverly Gust, MD   Encounter Date: 02/20/2021   PT End of Session - 02/20/21 1650     Visit Number 12    Number of Visits 25    Date for PT Re-Evaluation 02/17/21    Authorization Type Medicare [employer]    Authorization Time Period 11/25/20-02/17/21    PT Start Time 1525    PT Stop Time 1559    PT Time Calculation (min) 34 min    Equipment Utilized During Treatment Gait belt    Activity Tolerance Patient tolerated treatment well    Behavior During Therapy [facility] for tasks assessed/performed             Past Medical History:  Diagnosis Date   Allergic rhinitis    with eczema   Anemia    Asthma    without status asthmaticus; with allergic component   Colon polyps    Depression    Diabetes mellitus without complication ([place])    History of carpal tunnel syndrome    bilateral   History of chest pain    negative stress test 05/2004   Hyperlipidemia    Hypertension    Idiopathic peripheral neuropathy    Microscopic hematuria    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 Cephus Shelling   Osteoarthritis    with history of whiplash   Sleep apnea    AHI 43.6, CPAP of 11 initiated   Vitamin D deficiency     Past Surgical History:  Procedure Laterality Date   ABDOMINAL HYSTERECTOMY     TAH/BSO with uterine enlargement, fibroids   CARPAL TUNNEL RELEASE Bilateral    CATARACT EXTRACTION Bilateral    COLONOSCOPY  10/16/2005   adenomatous polyp   COLONOSCOPY WITH PROPOFOL N/A 05/27/2015   Procedure: COLONOSCOPY WITH PROPOFOL;  Surgeon: Manya Silvas, MD;  Location: [facility] ENDOSCOPY;  Service: Endoscopy;  Laterality: N/A;    ESOPHAGOGASTRODUODENOSCOPY (EGD) WITH PROPOFOL N/A 05/27/2015   Procedure: ESOPHAGOGASTRODUODENOSCOPY (EGD) WITH PROPOFOL;  Surgeon: Manya Silvas, MD;  Location: [facility] ENDOSCOPY;  Service: Endoscopy;  Laterality: N/A;   INGUINAL HERNIA REPAIR Right 06/17/2015   Procedure: HERNIA REPAIR INGUINAL ADULT;  Surgeon: Leonie Green, MD;  Location: [facility] ORS;  Service: General;  Laterality: Right;    There were no vitals filed for this visit.   Subjective Assessment - 02/20/21 1526     Subjective Pt reports she fell recently when walking her dog at night. She reports she fell due to stepping off a curb she didn't see. Pt reports she didn't hurt herself and fell in the grass. Pt reports no other issues since last appointment. Reports results from neurologist were normal.    Pertinent History Pt presents to PT ambulating without an AD. Pt reports her main problem is feelings of imbalance, not dizziness. She reports onset of imbalance started July 4th 2021, where she was preparing to go on vacation and noticed she was walking toward her L side. She says her balance with walking continued to worsen but has since "leveled out." She has fallen 2x in the last six months, denies hitting her head with falls. She feels most off balance with looking  over her shoulder, bending/looking down, and sometimes with looking up. She feels she is still moving when she is walking and tries to stop and says, "I have to get my footing." She denies any spinning sensation, although she reports she did recently get treated at ENT for "crystals in my ear," a few months ago and that it helped. She has also recently had hearing tested and reports she is unable to hear higher pitches. She denies aural fullness, drainage, but does report ringing in her ears. She denies any feelings of light-headedness. Pt reports no headaches or sensitivity to light. She reports sometimes she does feel nauseated but that she has "stomach problems" and  recently had diverticulitis. Pt has history of UTIs and was recently treated for one "a week ago," but reports no improvements in her balance/unsteadiness. Other PMH per chart includes anemia, ashtma, DM, B carpal tunnel syndrome, hx of chest pain (negative stress test 2006), hx stomach ulcers, HLD, HTN, idiopathic peripheral neuropathy, migraine, mood disorder, osteoarthritis w history of whiplash, sleep apnea, vitamin d deficiency, depression.    Currently in Pain? No/denies            INTERVENTIONS - CGA provided throughout unless otherwise noted  At support surface:  NBOS, EC 30sec; slight increase in sway. Pt rates easy Tandem stance 30 sec - frequent UE support Semi-tandem stance with EC 2x30 sec each LE - intermittent UE support --progressed to performing with dual task (counting backwards from 100 by 1s). Pt challenged. Some errors with dual task.  On airex pad: NBOS, EO 30 sec; no LOB NBOS, EC x60 sec, x30 sec, intermittent UE support to regain balance. Improvement with time Semi-tandem with EO x60 sec each LE --progressed to with EC 30 sec each LE. Pt must use intermittent UE support on bar. Rates medium challenge.  Dynamic balance interventions, ambulating VORx1 with horizontal head turns 5x10 meters. Cuing to increase speed of head turns. Pt with decreased fluidity of cervical rotation.  Dynamic balance interventions, ambulating VORx1 with vertical head turns 4x Ambulating in hallway incorporating vertical and horizontal head turns with reading sticky notes on walls - 3x length of hallway. Pt cued due to errors with dual task, encouraged to scan near top of  wall.  SLB - 4x30 sec each LE; intermittent UE support. More challenged on LLE.   Pt educated throughout session about proper posture and technique with exercises. Improved exercise technique, movement at target joints, use of target muscles after min to mod verbal, visual, tactile cues.  Addendum completed to carry  forward goals (testing deferred to next session) and for recert.   PT Education - 02/20/21 1649     Education Details exercise technique, body mechanics, reinforced importance of HEP    Person(s) Educated Patient    Methods Explanation;Demonstration;Verbal cues    Comprehension Verbalized understanding;Returned demonstration;Verbal cues required;Need further instruction              PT Short Term Goals - 02/06/21 1201       PT SHORT TERM GOAL #1   Title Patient will be independent in home exercise program to improve strength/mobility and balance for increased safety with ADLs.    Baseline 8/5: to be initiated    Time 6    Period Weeks    Status On-going    Target Date 01/06/21               PT Long Term Goals - 02/06/21 1201  PT LONG TERM GOAL #1   Title Patient will reduce dizziness handicap inventory score by at least 13 points for less dizziness with ADLs and increased safety with home and work tasks.    Baseline 8/5: 28; 10/17: 12 (pt reports that she doesn't have dizziness issues)    Time 12    Period Weeks    Status Achieved    Target Date 02/17/21      PT LONG TERM GOAL #2   Title Patient will increase ABC scale score >80% to demonstrate better functional mobility and better confidence with ADLs.    Baseline 8/5: 66.25%; 10/17: 77.5%    Time 12    Period Weeks    Status On-going    Target Date 02/17/21      PT LONG TERM GOAL #3   Title Patient will increase FOTO score to equal to or greater than 70 to demonstrate improvement in mobility and quality of life.    Baseline 8/5: 59; 10/17: 62    Time 12    Period Weeks    Status On-going    Target Date 02/17/21      PT LONG TERM GOAL #4   Title Patient will increase Berg Balance score by > 6 points to demonstrate decreased fall risk during functional activities.    Baseline 8/5: to be completed future session; 02/06/21: 44/56    Time 12    Period Weeks    Status On-going    Target Date 02/17/21       PT LONG TERM GOAL #5   Title Patient will tolerate 5 seconds of single leg stance without loss of balance to improve ability to get in and out of shower safely.    Baseline 8/5: Pt maintains 1-3 sec of SLB BLEs; 02/07/24: <5sec c trunk ataxia    Time 12    Period Weeks    Target Date 02/17/21                   Plan - 02/20/21 1557     Clinical Impression Statement Pt returns to PT after brief absence. Session limited due to pt late arrival. Pt most challenged today with balance interventions involving dual cognitive tasks, making frequent errors. PT reinforces importance of HEP. The pt will benefit from further skilled PT to improve balance, gait and to decrease fall risk.    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    Examination-Activity Limitations Bed Mobility    Examination-Participation Restrictions Community Activity;Laundry;Yard Work;Cleaning;Driving;Meal Prep;Shop    Stability/Clinical Decision Making Evolving/Moderate complexity    Rehab Potential Good    PT Frequency 2x / week    PT Duration 12 weeks    PT Treatment/Interventions ADLs/Self Care Home Management;Canalith Repostioning;Cryotherapy;Electrical Stimulation;Moist Heat;Ultrasound;DME Instruction;Gait training;Stair training;Functional mobility training;Therapeutic activities;Therapeutic exercise;Balance training;Neuromuscular re-education;Patient/family education;Manual techniques;Orthotic Fit/Training;Passive range of motion;Dry needling;Energy conservation;Splinting;Taping;Vestibular;Visual/perceptual  remediation/compensation;Joint Manipulations    PT Next Visit Plan FU on HEP compliance; high volume practice of single limb and tandem stance static postural control training    PT Home Exercise Plan Seated VORx1 with vertical head turns, daily, in 30 second bouts for a total of 3 minutes; no updates 12/12/20; 8/24:  Access Code: [email or web address]  Consulted and Agree with Plan of Care Patient             Patient will benefit from skilled therapeutic intervention in order to improve the following deficits and impairments:  Abnormal gait, Decreased activity tolerance, Decreased range of motion, Decreased knowledge of use of DME, Decreased strength, Dizziness, Improper body mechanics, Decreased balance, Decreased mobility, Difficulty walking, Increased muscle spasms, Impaired flexibility, Postural dysfunction, Hypomobility  Visit Diagnosis: Unsteadiness on feet  Other lack of coordination  Other abnormalities of gait and mobility     Problem List There are no problems to display for this patient.   Zollie Pee, PT 02/20/2021, 4:53 PM  [place] MAIN [facility] SERVICES [address] [place], [place], [address] Phone: [phone]   Fax:  [phone]  Name: Desiree Ramirez MRN: [id number] Date of Birth: 1950/07/29

## 2021-02-21 NOTE — Addendum Note (Signed)
Addended by: Baird Kay on: 02/21/2021 04:28 PM   Modules accepted: Orders

## 2021-02-22 ENCOUNTER — Ambulatory Visit: Payer: Medicare Other

## 2021-02-27 ENCOUNTER — Ambulatory Visit: Payer: Medicare Other

## 2021-03-01 ENCOUNTER — Ambulatory Visit: Payer: Medicare Other

## 2021-03-02 ENCOUNTER — Other Ambulatory Visit: Payer: Self-pay

## 2021-03-02 ENCOUNTER — Ambulatory Visit: Payer: Medicare Other | Attending: Unknown Physician Specialty

## 2021-03-02 DIAGNOSIS — R2689 Other abnormalities of gait and mobility: Secondary | ICD-10-CM | POA: Insufficient documentation

## 2021-03-02 NOTE — Therapy (Signed)
[place] MAIN [facility] SERVICES [address] [place], [place], [address] Phone: [phone]   Fax:  [phone]  Physical Therapy Treatment/DISCHARGE SUMMARY  Patient Details  Name: Desiree Ramirez MRN: [id number] Date of Birth: 1951/01/23 Referring Provider (PT): Beverly Gust, MD   Encounter Date: 03/02/2021   PT End of Session - 03/02/21 1215     Visit Number 13    Number of Visits 25    Date for PT Re-Evaluation 02/17/21    Authorization Type Medicare [employer] A[employer]    Authorization Time Period 11/25/20-02/17/21    PT Start Time 0915    PT Stop Time 0950    PT Time Calculation (min) 35 min    Equipment Utilized During Treatment Gait belt    Activity Tolerance Patient tolerated treatment well    Behavior During Therapy [facility] for tasks assessed/performed             Past Medical History:  Diagnosis Date   Allergic rhinitis    with eczema   Anemia    Asthma    without status asthmaticus; with allergic component   Colon polyps    Depression    Diabetes mellitus without complication ([place])    History of carpal tunnel syndrome    bilateral   History of chest pain    negative stress test 05/2004   Hyperlipidemia    Hypertension    Idiopathic peripheral neuropathy    Microscopic hematuria    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 Cephus Shelling   Osteoarthritis    with history of whiplash   Sleep apnea    AHI 43.6, CPAP of 11 initiated   Vitamin D deficiency     Past Surgical History:  Procedure Laterality Date   ABDOMINAL HYSTERECTOMY     TAH/BSO with uterine enlargement, fibroids   CARPAL TUNNEL RELEASE Bilateral    CATARACT EXTRACTION Bilateral    COLONOSCOPY  10/16/2005   adenomatous polyp   COLONOSCOPY WITH PROPOFOL N/A 05/27/2015   Procedure: COLONOSCOPY WITH PROPOFOL;  Surgeon: Manya Silvas, MD;  Location: [facility] ENDOSCOPY;  Service: Endoscopy;   Laterality: N/A;   ESOPHAGOGASTRODUODENOSCOPY (EGD) WITH PROPOFOL N/A 05/27/2015   Procedure: ESOPHAGOGASTRODUODENOSCOPY (EGD) WITH PROPOFOL;  Surgeon: Manya Silvas, MD;  Location: [facility] ENDOSCOPY;  Service: Endoscopy;  Laterality: N/A;   INGUINAL HERNIA REPAIR Right 06/17/2015   Procedure: HERNIA REPAIR INGUINAL ADULT;  Surgeon: Leonie Green, MD;  Location: [facility] ORS;  Service: General;  Laterality: Right;    There were no vitals filed for this visit.   Subjective Assessment - 03/02/21 0917     Subjective Pt reports no stumbles or falls. She reports no pain. The pt reports she has not had any issues with her balance. Pt states, "I am doing much better," and reports no remaining concerns.    Patient is accompained by: Family member    Pertinent History Pt presents to PT ambulating without an AD. Pt reports her main problem is feelings of imbalance, not dizziness. She reports onset of imbalance started July 4th 2021, where she was preparing to go on vacation and noticed she was walking toward her L side. She says her balance with walking continued to worsen but has since "leveled out." She has fallen 2x in the last six months, denies hitting her head with falls. She feels most off balance with looking over her shoulder, bending/looking down,  and sometimes with looking up. She feels she is still moving when she is walking and tries to stop and says, "I have to get my footing." She denies any spinning sensation, although she reports she did recently get treated at ENT for "crystals in my ear," a few months ago and that it helped. She has also recently had hearing tested and reports she is unable to hear higher pitches. She denies aural fullness, drainage, but does report ringing in her ears. She denies any feelings of light-headedness. Pt reports no headaches or sensitivity to light. She reports sometimes she does feel nauseated but that she has "stomach problems" and recently had diverticulitis. Pt  has history of UTIs and was recently treated for one "a week ago," but reports no improvements in her balance/unsteadiness. Other PMH per chart includes anemia, ashtma, DM, B carpal tunnel syndrome, hx of chest pain (negative stress test 2006), hx stomach ulcers, HLD, HTN, idiopathic peripheral neuropathy, migraine, mood disorder, osteoarthritis w history of whiplash, sleep apnea, vitamin d deficiency, depression.    Limitations House hold activities;Walking    How long can you sit comfortably? unlimited    How long can you stand comfortably? unlimited    How long can you walk comfortably? unlimited    Diagnostic tests Pt recently had VNG testing completed where per chart pt had no nystagmus or subjective dizziness with Marye Round, no spontaneous nystagmus, pt noted to have abnormal smooth pursuits and saccades, abnormal optokinetic testing, no post headshake nystagmus, normal caloric testing and normal positional testing. Pt also had hearing testing completed where per chart pt with sensorineural hearing loss.    Patient Stated Goals I just want to feel more secure when I'm doing anything.    Currently in Pain? No/denies            INTERVENTIONS -   FOTO: 62 (unchaged from 10/17)  Harmon: 28 (previously achieved, however previous score was 12)  ABC Scale: 83.75% (goal achieved)  SLB: LLE 10 sec, R LE 9 sec (goal achieved)  Berg: 54/56 (achieved goal)  Access Code: [email or web address] URL: [email or web address] Date: 03/02/2021 Prepared by: Ricard Dillon  Exercises Standing Tandem Balance with Counter Support - 1 x daily - 7 x weekly - 2 sets - 2 reps - 30 hold Standing Single Leg Stance with Counter Support - 1 x daily - 7 x weekly - 2 sets - 2 reps - 30 hold Standing Balance in Corner with Eyes Closed - 1 x daily - 7 x weekly - 2 sets - 2 reps - 30 hold     Pt educated throughout session about proper posture and technique with exercises. Improved exercise technique, movement  at target joints, use of target muscles after min to mod verbal, visual, tactile cues.      PT Education - 03/02/21 1215     Education Details discharge recommendations, HEP to maintain gains beyond PT following d/c    Person(s) Educated Patient    Methods Demonstration;Tactile cues;Verbal cues;Handout;Explanation    Comprehension Verbalized understanding;Returned demonstration              PT Short Term Goals - 03/02/21 0937       PT SHORT TERM GOAL #1   Title Patient will be independent in home exercise program to improve strength/mobility and balance for increased safety with ADLs.    Baseline 8/5: to be initiated; 11/10: Pt issued additional HEP to perform after d/c, pt reports no questions about former HEP but that she  has not been consistent in performing it    Time 6    Period Weeks    Status Partially Met    Target Date 01/06/21               PT Long Term Goals - 03/02/21 0919       PT LONG TERM GOAL #1   Title Patient will reduce dizziness handicap inventory score by at least 13 points for less dizziness with ADLs and increased safety with home and work tasks.    Baseline 8/5: 28; 10/17: 12 (pt reports that she doesn't have dizziness issues); 11/10: 28    Time 12    Period Weeks    Status Achieved      PT LONG TERM GOAL #2   Title Patient will increase ABC scale score >80% to demonstrate better functional mobility and better confidence with ADLs.    Baseline 8/5: 66.25%; 10/17: 77.5%; 11/10: 83.75%    Time 12    Period Weeks    Status Achieved      PT LONG TERM GOAL #3   Title Patient will increase FOTO score to equal to or greater than 70 to demonstrate improvement in mobility and quality of life.    Baseline 8/5: 59; 10/17: 62; 11/10: 62    Time 12    Period Weeks    Status On-going      PT LONG TERM GOAL #4   Title Patient will increase Berg Balance score by > 6 points to demonstrate decreased fall risk during functional activities.    Baseline  8/5: to be completed future session; 02/06/21: 44/56; 11/10: 54/56 (achieved goal)    Time 12    Period Weeks    Status On-going      PT LONG TERM GOAL #5   Title Patient will tolerate 5 seconds of single leg stance without loss of balance to improve ability to get in and out of shower safely.    Baseline 8/5: Pt maintains 1-3 sec of SLB BLEs; 02/07/24: <5sec c trunk ataxia; 11/10: LLE 10 sec R LE 9 sec    Time 12    Period Weeks    Status Achieved                   Plan - 03/02/21 1225     Clinical Impression Statement Pt has achieved majority of remaining PT goals with the exception of FOTO score that remained unchanged from prior assessment (score 62). Pt improved ABC Scale, SLB and Berg scores, indicating increased balance confidence, balance, and decreased fall risk. Pt reports she no longer has concerns regarding her balance and has not experienced any symptoms that woud make her feel unsteady. PT issued and reviewed HEP with pt to perform beyond therapy to maintain gains. PT instructed pt to contact clinic should she have any questions or concerns after discharge from PT. Pt verbalized understanding for all and demo'd correct technique with new addition to HEP. The pt does not require further skilled PT at this time.    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    Examination-Activity Limitations Bed Mobility    Examination-Participation Restrictions Community Activity;Laundry;Yard Work;Cleaning;Driving;Meal Prep;Shop  Stability/Clinical Decision Making Evolving/Moderate complexity     Rehab Potential Good    PT Frequency 2x / week    PT Duration 12 weeks    PT Treatment/Interventions ADLs/Self Care Home Management;Canalith Repostioning;Cryotherapy;Electrical Stimulation;Moist Heat;Ultrasound;DME Instruction;Gait training;Stair training;Functional mobility training;Therapeutic activities;Therapeutic exercise;Balance training;Neuromuscular re-education;Patient/family education;Manual techniques;Orthotic Fit/Training;Passive range of motion;Dry needling;Energy conservation;Splinting;Taping;Vestibular;Visual/perceptual remediation/compensation;Joint Manipulations    PT Next Visit Plan FU on HEP compliance; high volume practice of single limb and tandem stance static postural control training    PT Home Exercise Plan Seated VORx1 with vertical head turns, daily, in 30 second bouts for a total of 3 minutes; no updates 12/12/20; 8/24:  Access Code: [email or web address]; 11/10: Access Code: [email or web address]    Consulted and Agree with Plan of Care Patient             Patient will benefit from skilled therapeutic intervention in order to improve the following deficits and impairments:  Abnormal gait, Decreased activity tolerance, Decreased range of motion, Decreased knowledge of use of DME, Decreased strength, Dizziness, Improper body mechanics, Decreased balance, Decreased mobility, Difficulty walking, Increased muscle spasms, Impaired flexibility, Postural dysfunction, Hypomobility  Visit Diagnosis: Other abnormalities of gait and mobility     Problem List There are no problems to display for this patient.   Zollie Pee, PT 03/02/2021, 12:32 PM  [place] MAIN [facility] SERVICES [address] [place], [place], [address] Phone: [phone]   Fax:  [phone]  Name: THURMA PRIEGO MRN: [id number] Date of Birth: 1950-08-19

## 2021-03-03 ENCOUNTER — Ambulatory Visit: Payer: Medicare Other

## 2021-03-06 ENCOUNTER — Ambulatory Visit: Payer: Medicare Other

## 2021-03-09 ENCOUNTER — Other Ambulatory Visit: Payer: Self-pay | Admitting: Otolaryngology

## 2021-03-09 ENCOUNTER — Ambulatory Visit: Payer: Medicare Other

## 2021-03-09 DIAGNOSIS — E041 Nontoxic single thyroid nodule: Secondary | ICD-10-CM

## 2021-03-13 ENCOUNTER — Ambulatory Visit: Payer: Medicare Other

## 2021-03-15 ENCOUNTER — Ambulatory Visit: Payer: Medicare Other

## 2021-03-20 ENCOUNTER — Other Ambulatory Visit: Payer: Self-pay

## 2021-03-20 ENCOUNTER — Ambulatory Visit: Payer: Medicare Other

## 2021-03-20 ENCOUNTER — Ambulatory Visit
Admission: RE | Admit: 2021-03-20 | Discharge: 2021-03-20 | Disposition: A | Discharge status: Home / Self Care | Payer: Medicare Other | Source: Ambulatory Visit | Attending: Otolaryngology | Admitting: Otolaryngology

## 2021-03-20 DIAGNOSIS — E041 Nontoxic single thyroid nodule: Secondary | ICD-10-CM | POA: Insufficient documentation

## 2021-03-21 ENCOUNTER — Other Ambulatory Visit: Payer: Self-pay | Admitting: Otolaryngology

## 2021-03-21 ENCOUNTER — Other Ambulatory Visit (HOSPITAL_COMMUNITY): Payer: Self-pay | Admitting: Otolaryngology

## 2021-03-21 DIAGNOSIS — E041 Nontoxic single thyroid nodule: Secondary | ICD-10-CM

## 2021-03-23 ENCOUNTER — Ambulatory Visit: Payer: Medicare Other

## 2021-03-27 ENCOUNTER — Ambulatory Visit: Payer: Medicare Other

## 2021-03-29 ENCOUNTER — Ambulatory Visit: Payer: Medicare Other

## 2022-01-11 ENCOUNTER — Other Ambulatory Visit: Payer: Self-pay | Admitting: Student

## 2022-01-11 DIAGNOSIS — R202 Paresthesia of skin: Secondary | ICD-10-CM

## 2022-01-19 ENCOUNTER — Ambulatory Visit
Admission: RE | Admit: 2022-01-19 | Discharge: 2022-01-19 | Disposition: A | Discharge status: Home / Self Care | Payer: Medicare Other | Source: Ambulatory Visit | Attending: Student | Admitting: Student

## 2022-01-19 DIAGNOSIS — R202 Paresthesia of skin: Secondary | ICD-10-CM | POA: Insufficient documentation

## 2022-01-25 DIAGNOSIS — E041 Nontoxic single thyroid nodule: Secondary | ICD-10-CM | POA: Insufficient documentation

## 2022-03-20 ENCOUNTER — Ambulatory Visit
Admission: RE | Admit: 2022-03-20 | Discharge: 2022-03-20 | Disposition: A | Discharge status: Home / Self Care | Payer: Medicare Other | Source: Ambulatory Visit | Attending: Otolaryngology | Admitting: Otolaryngology

## 2022-03-20 DIAGNOSIS — E041 Nontoxic single thyroid nodule: Secondary | ICD-10-CM | POA: Diagnosis present

## 2022-03-22 ENCOUNTER — Other Ambulatory Visit: Payer: Self-pay | Admitting: Otolaryngology

## 2022-03-22 DIAGNOSIS — E041 Nontoxic single thyroid nodule: Secondary | ICD-10-CM

## 2022-08-06 DIAGNOSIS — N2581 Secondary hyperparathyroidism of renal origin: Secondary | ICD-10-CM | POA: Insufficient documentation

## 2022-08-06 DIAGNOSIS — N1831 Chronic kidney disease, stage 3a: Secondary | ICD-10-CM | POA: Insufficient documentation

## 2022-09-25 IMAGING — CT CT HEAD W/O CM
3 series · 16 of 47 positions shown, 19 images · non-contrast
Comparison: None.

CLINICAL DATA: Dizziness, vertigo

EXAM:
CT HEAD WITHOUT CONTRAST
TECHNIQUE: Contiguous axial images were obtained from the base of the skull
through the vertex without intravenous contrast.

[Series 2: head wo · axial · 0.40mm/px · z∈[+65,+195]mm · 10 of 32 slices shown, 13 images]
[im 3/32  brain]
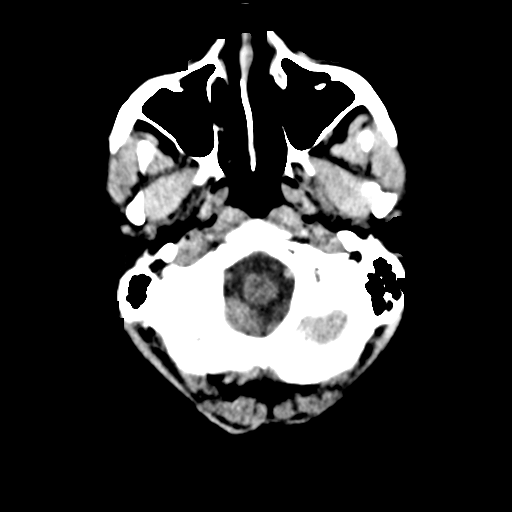
[im 3/32  bone]
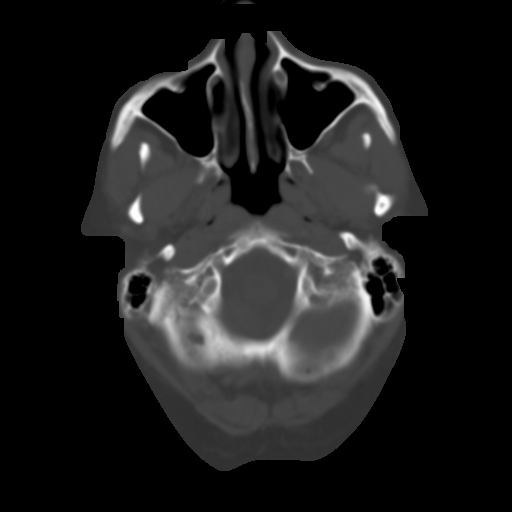
[im 6/32  brain]
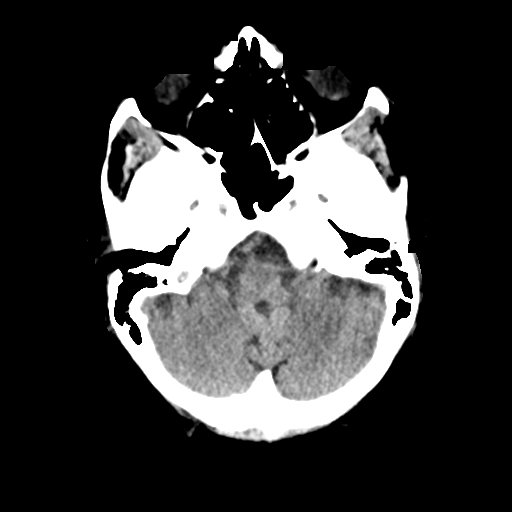
[im 9/32  brain]
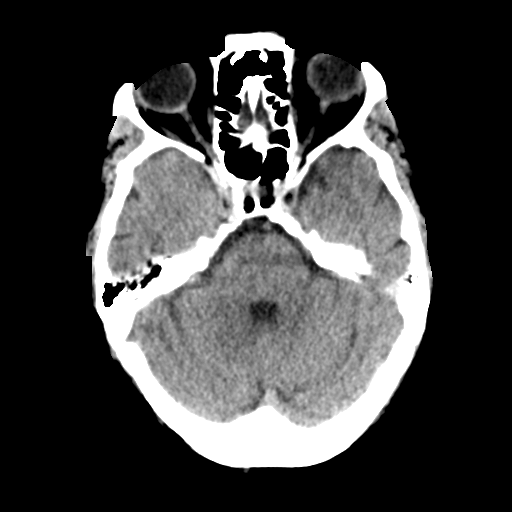
[im 11/32  brain]
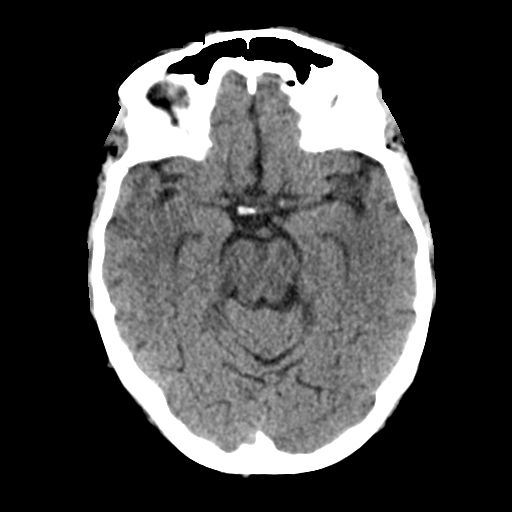
[im 14/32  brain]
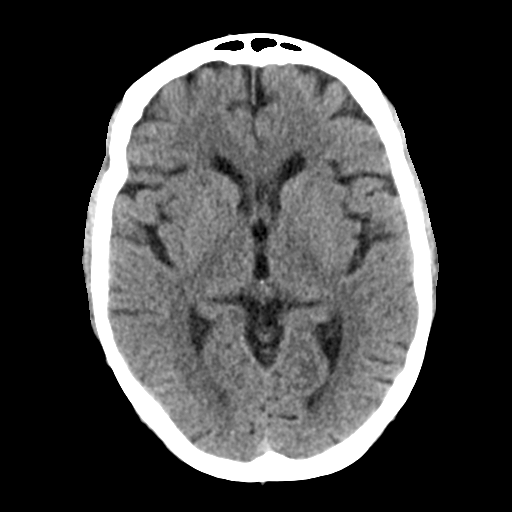
[im 14/32  bone]
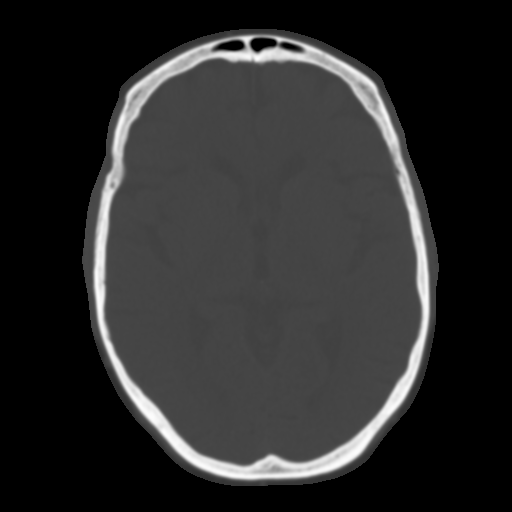
[im 18/32  brain]
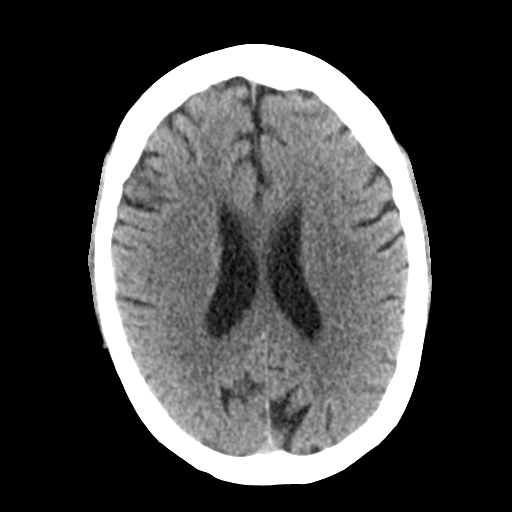
[im 21/32  brain]
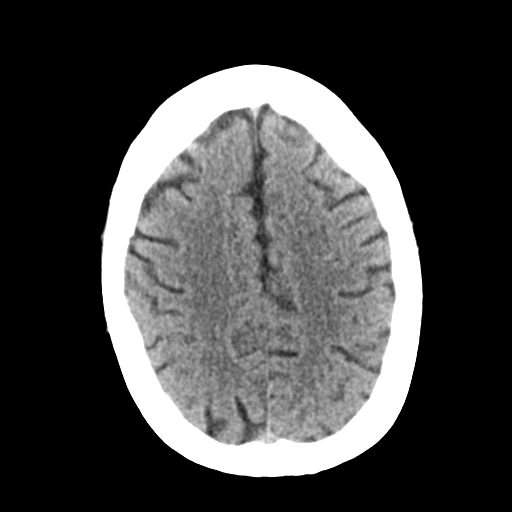
[im 24/32  brain]
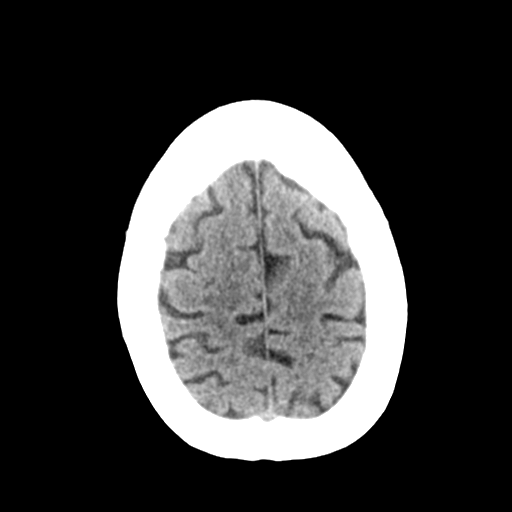
[im 26/32  brain]
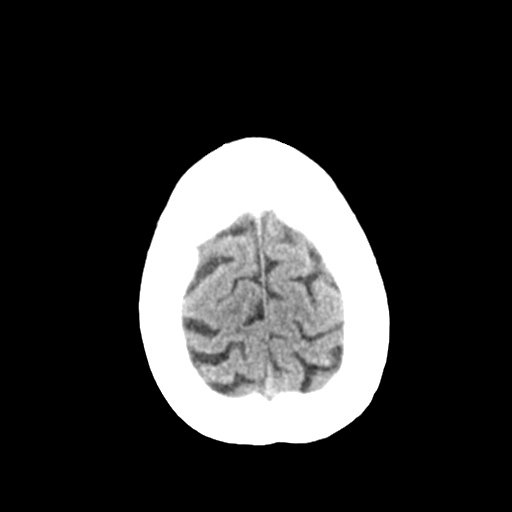
[im 26/32  bone]
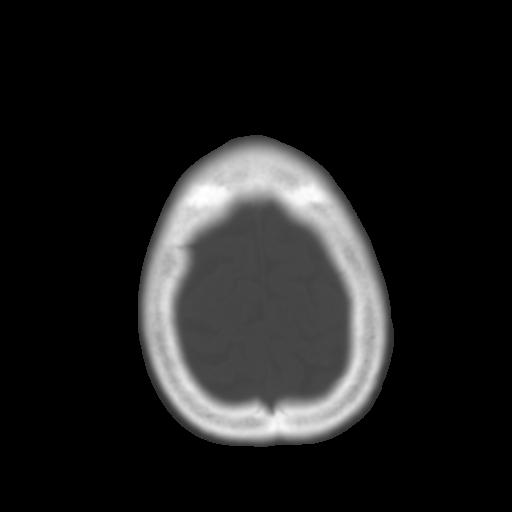
[im 29/32  brain]
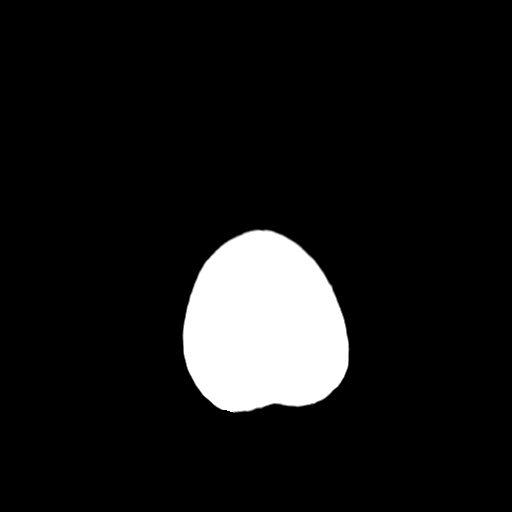

[Series 4: coronal soft tissue · coronal · 0.31mm/px · 3 of 66 slices shown]
[im 22/66  brain]
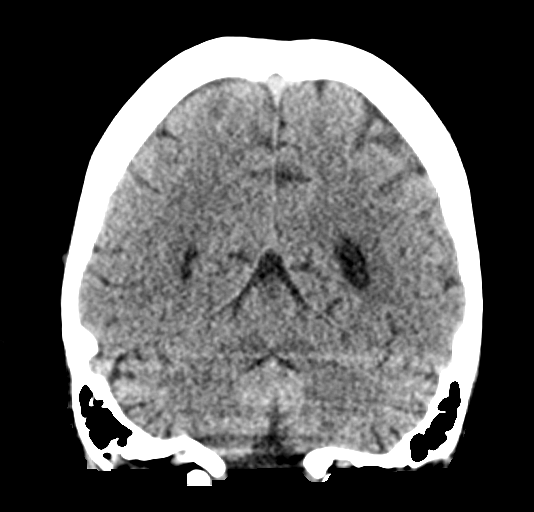
[im 29/66  brain]
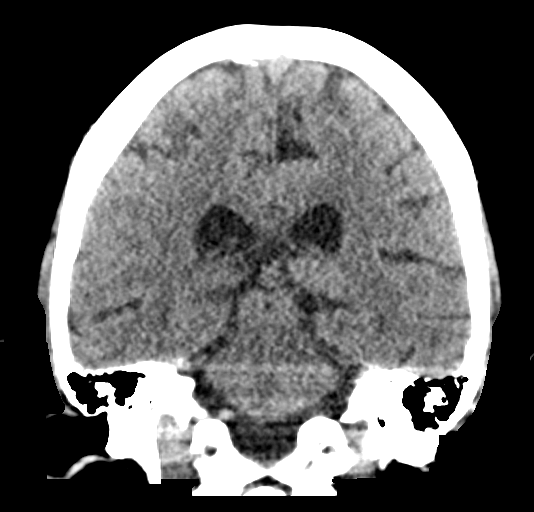
[im 37/66  brain]
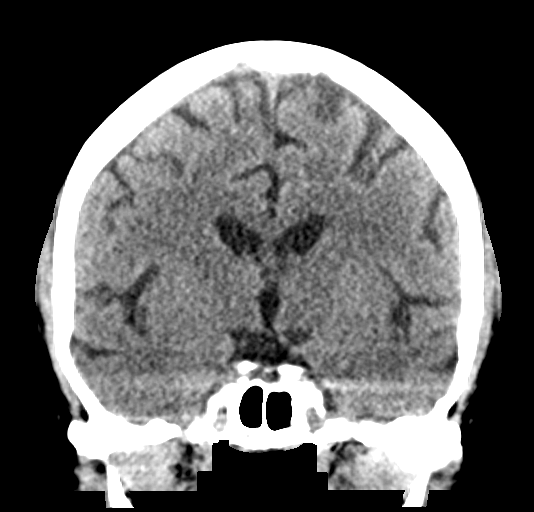

[Series 5: sagittal soft tissue · sagittal · 0.32mm/px · 3 of 56 slices shown]
[im 19/56  brain]
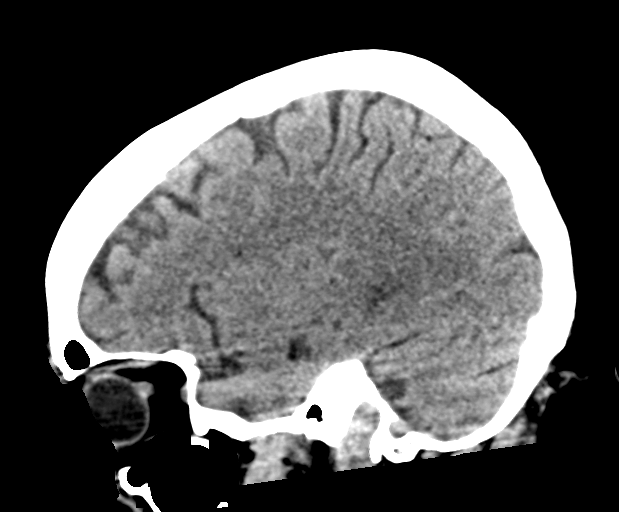
[im 28/56  brain]
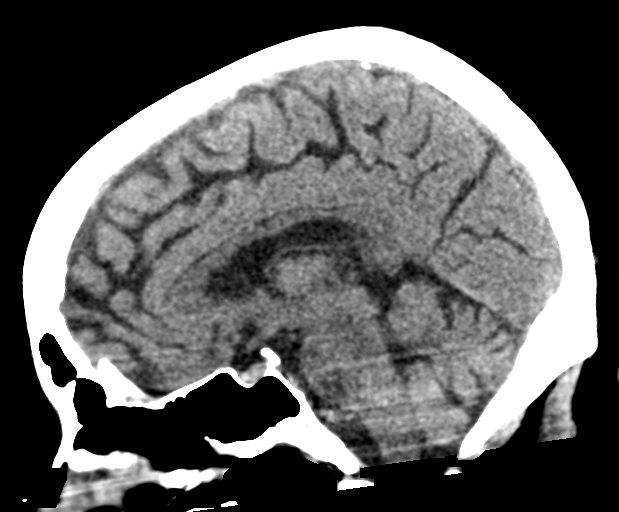
[im 37/56  brain]
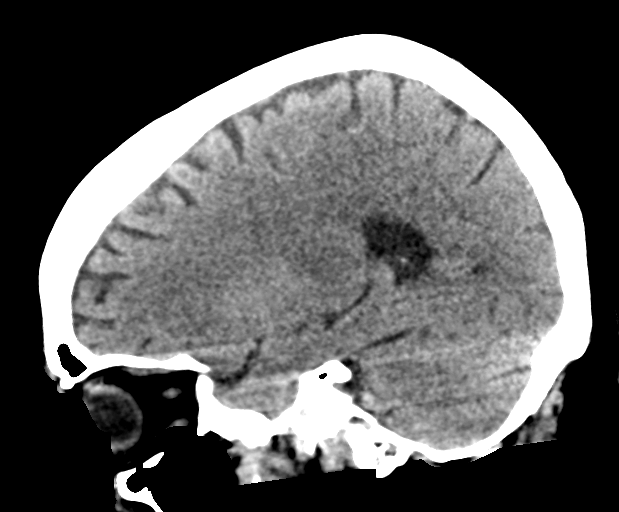

[16 of 47 positions shown; findings below may reference images not displayed]

FINDINGS: Brain: Mild atrophy pattern, age related. No acute intracranial
hemorrhage, mass lesion, new infarction, midline shift, herniation,
hydrocephalus, or extra-axial fluid collection. No focal mass effect
or edema. Cisterns are patent. No cerebellar abnormality.

Vascular: No hyperdense vessel or unexpected calcification.

Skull: Normal. Negative for fracture or focal lesion.

Sinuses/Orbits: No acute finding.

Other: None.
IMPRESSION: Minor atrophy pattern. No acute intracranial abnormality by
noncontrast CT.

## 2022-10-03 IMAGING — US US THYROID
1 series · 12 of 25 positions shown · non-contrast
Comparison: Carotid duplex ultrasound on 02/23/2020

CLINICAL DATA: Incidental on US. Right thyroid nodule detected by
carotid ultrasound.

EXAM:
THYROID ULTRASOUND
TECHNIQUE: Ultrasound examination of the thyroid gland and adjacent soft
tissues was performed.

[Series 1: us thyroid · 0.07mm/px · 63 acquisitions, 12 frames shown]
[im 3/63]
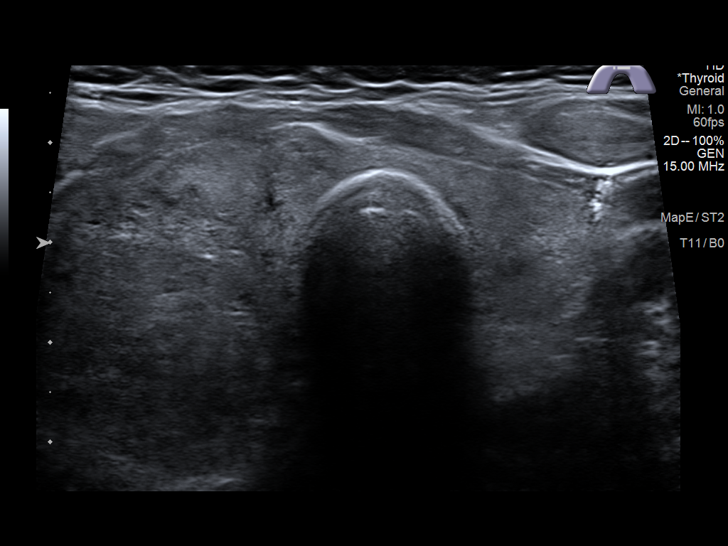
[im 8/63]
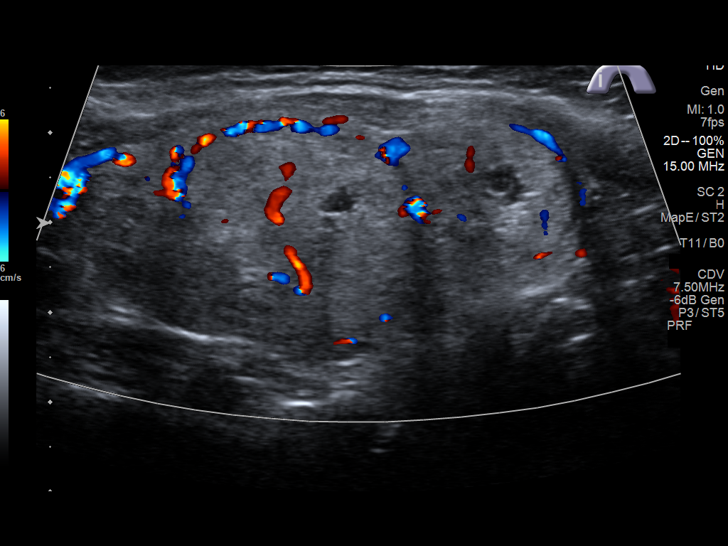
[im 13/63]
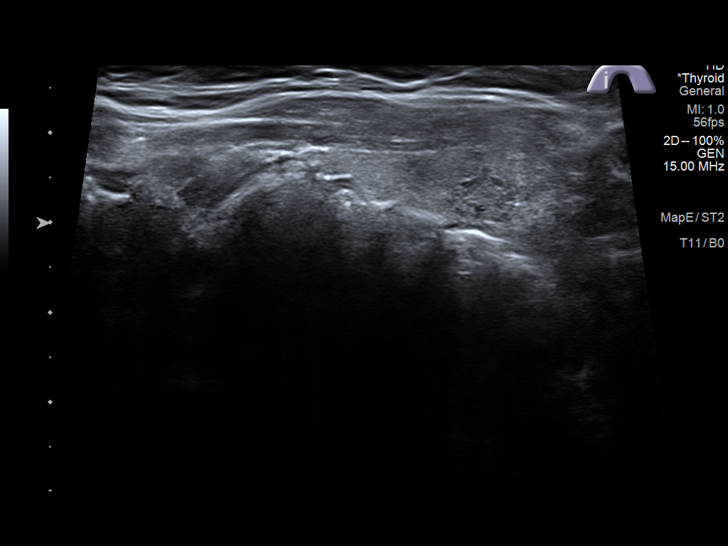
[im 19/63]
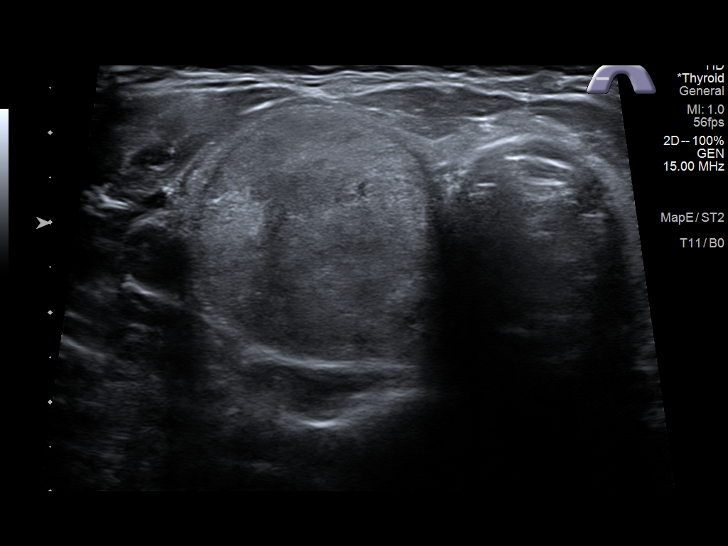
[im 24/63]
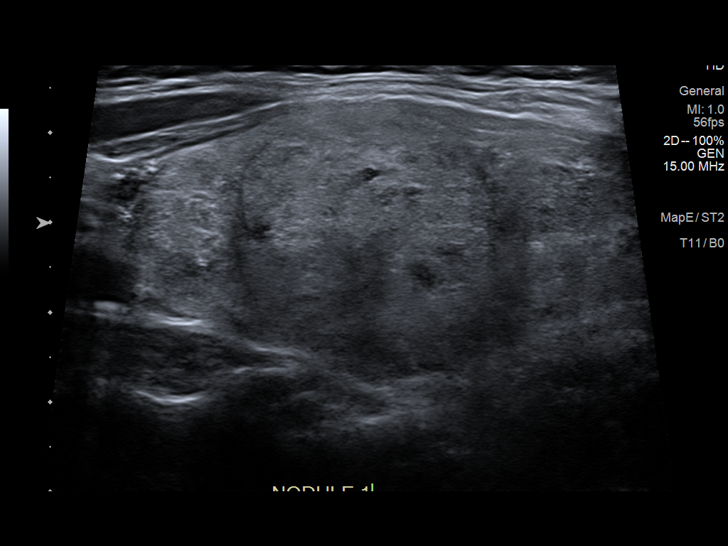
[im 29/63]
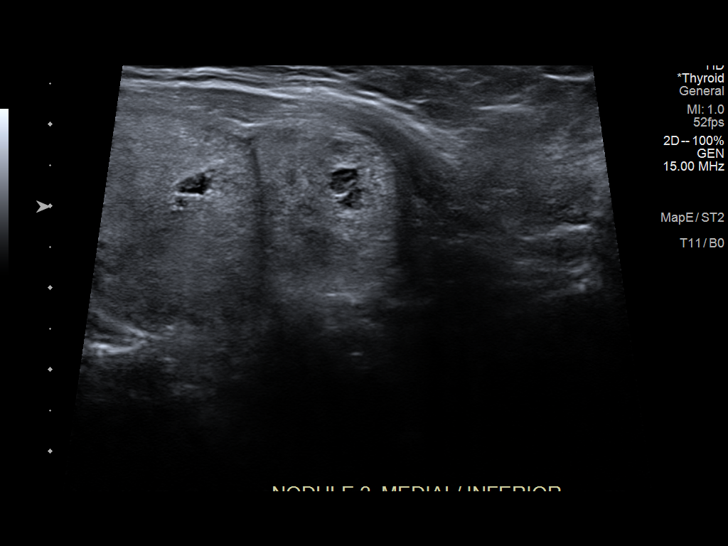
[im 34/63]
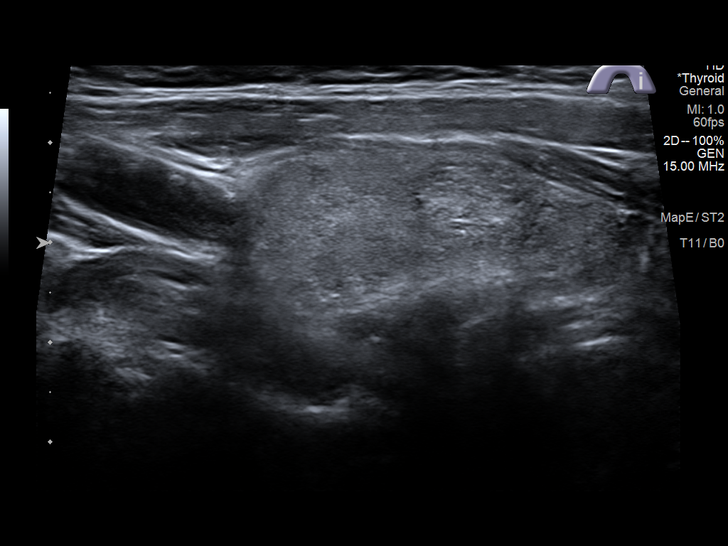
[im 39/63]
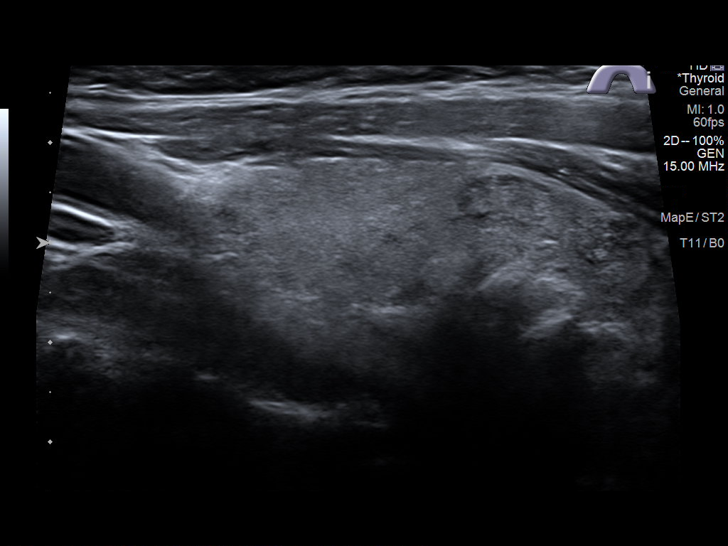
[im 44/63]
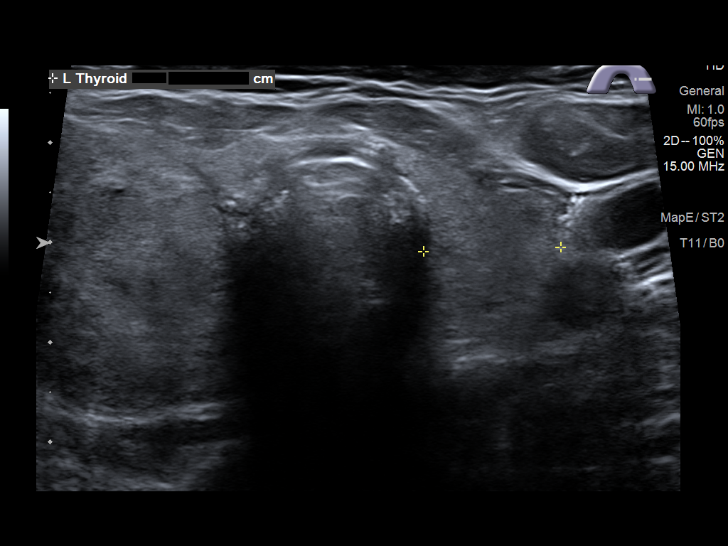
[im 50/63]
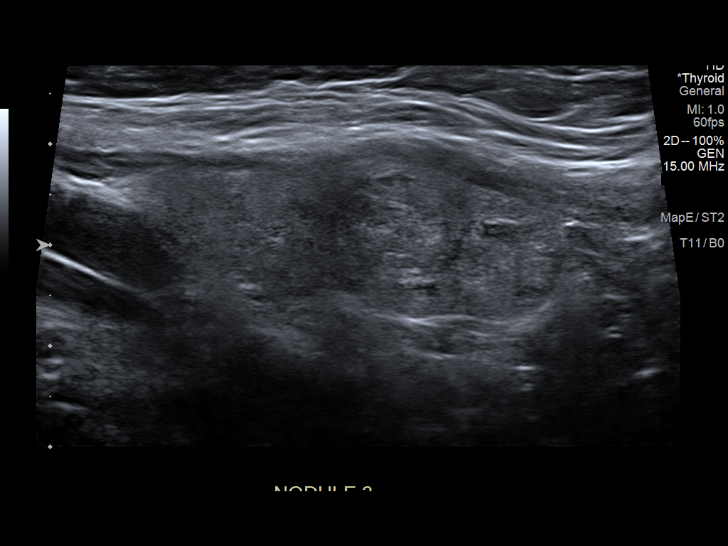
[im 55/63]
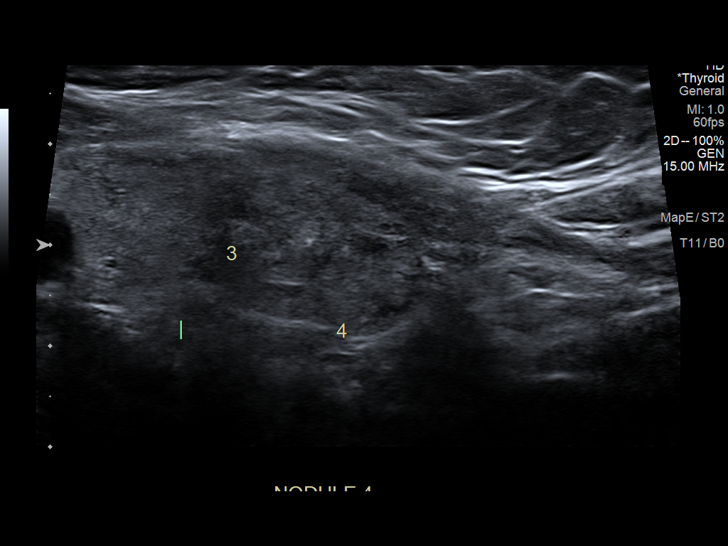
[im 60/63]
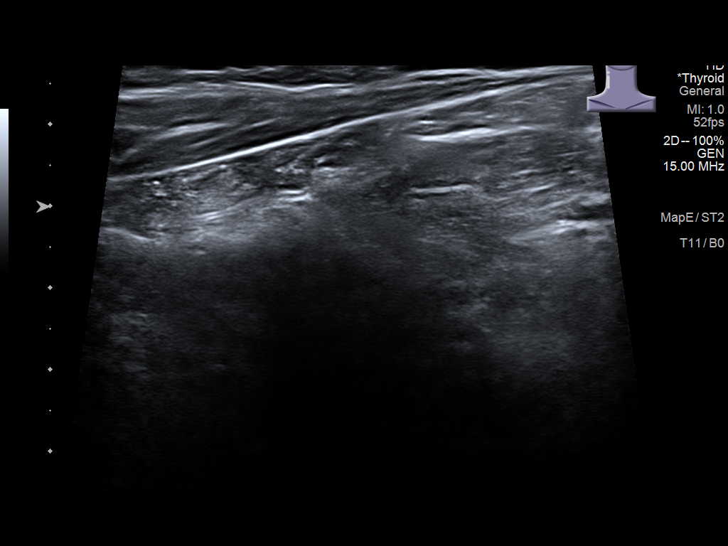

[12 of 25 positions shown; findings below may reference images not displayed]

FINDINGS: Parenchymal Echotexture: Mildly heterogenous

Isthmus: 0.3 cm

Right lobe: 6.4 x 3.1 x 2.9 cm

Left lobe: 3.9 x 1.7 x 1.4 cm

_________________________________________________________

Estimated total number of nodules >/= 1 cm: 4

Number of spongiform nodules >/=  2 cm not described below (TR1): 0

Number of mixed cystic and solid nodules >/= 1.5 cm not described
below (TR2): 0

_________________________________________________________

Nodule # 1:

Location: Right; Mid

Maximum size: 3.5 cm; Other 2 dimensions: 2.9 x 3.0 cm

Composition: solid/almost completely solid (2)

Echogenicity: isoechoic (1)

Shape: not taller-than-wide (0)

Margins: smooth (0)

Echogenic foci: none (0)

ACR TI-RADS total points: 3.

ACR TI-RADS risk category: TR3 (3 points).

ACR TI-RADS recommendations:

**Given size (>/= 2.5 cm) and appearance, fine needle aspiration of
this mildly suspicious nodule should be considered based on TI-RADS
criteria.

_________________________________________________________

Nodule # 2:

Location: Right; Inferior

Maximum size: 2.2 cm; Other 2 dimensions: 1.6 x 2.1 cm

Composition: solid/almost completely solid (2)

Echogenicity: isoechoic (1)

Shape: not taller-than-wide (0)

Margins: smooth (0)

Echogenic foci: none (0)

ACR TI-RADS total points: 3.

ACR TI-RADS risk category: TR3 (3 points).

ACR TI-RADS recommendations:

*Given size (>/= 1.5 - 2.4 cm) and appearance, a follow-up
ultrasound in 1 year should be considered based on TI-RADS criteria.

_________________________________________________________

Nodule # 3:

Location: Left; Inferior

Maximum size: 1.2 cm; Other 2 dimensions: 1.1 x 1.1 cm

Composition: solid/almost completely solid (2)

Echogenicity: isoechoic (1)

Shape: not taller-than-wide (0)

Margins: ill-defined (0)

Echogenic foci: none (0)

ACR TI-RADS total points: 3.

ACR TI-RADS risk category: TR3 (3 points).

ACR TI-RADS recommendations:

Given size (<1.4 cm) and appearance, this nodule does NOT meet
TI-RADS criteria for biopsy or dedicated follow-up.

_________________________________________________________

Nodule # 4:

Location: Left; Inferior

Maximum size: 1.1 cm; Other 2 dimensions: 1.1 x 1.0 cm

Composition: solid/almost completely solid (2)

Echogenicity: isoechoic (1)

Shape: not taller-than-wide (0)

Margins: ill-defined (0)

Echogenic foci: none (0)

ACR TI-RADS total points: 3.

ACR TI-RADS risk category: TR3 (3 points).

ACR TI-RADS recommendations:

Given size (<1.4 cm) and appearance, this nodule does NOT meet
TI-RADS criteria for biopsy or dedicated follow-up.

_________________________________________________________

No abnormal lymph nodes identified.
IMPRESSION: 1. 3.5 cm right mid thyroid nodule meets criteria for fine-needle
aspiration.
2. 2.2 cm right inferior thyroid nodule meets criteria for 1 year
follow-up ultrasound.
3. Two additional left inferior nodules do not meet criteria for
biopsy or dedicated follow-up.

The above is in keeping with the ACR TI-RADS recommendations - [HOSPITAL] 8845;[DATE].

## 2022-10-09 ENCOUNTER — Other Ambulatory Visit: Payer: Self-pay

## 2022-10-09 DIAGNOSIS — E041 Nontoxic single thyroid nodule: Secondary | ICD-10-CM

## 2022-10-10 ENCOUNTER — Other Ambulatory Visit: Payer: Self-pay | Admitting: Otolaryngology

## 2022-10-10 DIAGNOSIS — E041 Nontoxic single thyroid nodule: Secondary | ICD-10-CM

## 2022-10-29 ENCOUNTER — Ambulatory Visit: Admission: RE | Admit: 2022-10-29 | Payer: Medicare Other | Source: Ambulatory Visit

## 2022-10-29 DIAGNOSIS — E041 Nontoxic single thyroid nodule: Secondary | ICD-10-CM

## 2022-11-01 ENCOUNTER — Other Ambulatory Visit: Payer: Self-pay | Admitting: Otolaryngology

## 2022-11-01 DIAGNOSIS — E041 Nontoxic single thyroid nodule: Secondary | ICD-10-CM

## 2023-04-16 ENCOUNTER — Emergency Department: Payer: Medicare Other

## 2023-04-16 ENCOUNTER — Other Ambulatory Visit: Payer: Self-pay

## 2023-04-16 ENCOUNTER — Emergency Department
Admission: EM | Admit: 2023-04-16 | Discharge: 2023-04-16 | Disposition: A | Discharge status: Home / Self Care | Payer: Medicare Other | Attending: Emergency Medicine | Admitting: Emergency Medicine

## 2023-04-16 ENCOUNTER — Encounter: Payer: Self-pay | Admitting: Radiology

## 2023-04-16 DIAGNOSIS — E119 Type 2 diabetes mellitus without complications: Secondary | ICD-10-CM | POA: Diagnosis not present

## 2023-04-16 DIAGNOSIS — J45909 Unspecified asthma, uncomplicated: Secondary | ICD-10-CM | POA: Diagnosis not present

## 2023-04-16 DIAGNOSIS — S7001XA Contusion of right hip, initial encounter: Secondary | ICD-10-CM | POA: Diagnosis not present

## 2023-04-16 DIAGNOSIS — S2231XA Fracture of one rib, right side, initial encounter for closed fracture: Secondary | ICD-10-CM | POA: Diagnosis not present

## 2023-04-16 DIAGNOSIS — I10 Essential (primary) hypertension: Secondary | ICD-10-CM | POA: Diagnosis not present

## 2023-04-16 DIAGNOSIS — W19XXXA Unspecified fall, initial encounter: Secondary | ICD-10-CM | POA: Insufficient documentation

## 2023-04-16 DIAGNOSIS — R0781 Pleurodynia: Secondary | ICD-10-CM | POA: Diagnosis present

## 2023-04-16 MED ORDER — HYDROCODONE-ACETAMINOPHEN 5-325 MG PO TABS: 1.0000 | ORAL_TABLET | Freq: Four times a day (QID) | ORAL | 0 refills | Status: AC | PRN

## 2023-04-16 MED ORDER — HYDROCODONE-ACETAMINOPHEN 5-325 MG PO TABS
1.0000 | ORAL_TABLET | Freq: Once | ORAL | Status: AC
  Administered 2023-04-16: 1 via ORAL
  Filled 2023-04-16: qty 1

## 2023-04-16 MED ORDER — LIDOCAINE 5 % EX PTCH
1.0000 | MEDICATED_PATCH | CUTANEOUS | Status: DC
  Administered 2023-04-16: 1 via TRANSDERMAL
  Filled 2023-04-16: qty 1

## 2023-04-16 MED ORDER — LIDOCAINE 5 % EX PTCH: 1.0000 | MEDICATED_PATCH | Freq: Two times a day (BID) | CUTANEOUS | 0 refills | Status: DC

## 2023-04-16 NOTE — Discharge Instructions (Addendum)
Your chest CT showed a non-displaced fracture of the 9th rib on the right side.  The x-ray of your right hand, right hip and CT of your right hip were all negative for fractures.  You can take the Norco every 6 hours as needed for severe pain. You can take 650 mg of Tylenol and 600 mg of ibuprofen every 6 hours as needed for mild to moderate pain.  I also recommend applying topical pain relievers like lidocaine patches as well as using ice and heat.  Return to the ED with any new or worsening symptoms.  Follow-up with your primary care provider as needed.

## 2023-04-16 NOTE — ED Provider Notes (Addendum)
[facility] Provider Note    Event Date/Time   First MD Initiated Contact with Patient 04/16/23 2042     (approximate)   History   Fall   HPI  Desiree Ramirez is a 72 y.o. female PMH of diabetes, HTN, osteoarthritis, asthma and mood disorder presents for evaluation of right rib, right hip and right middle finger pain after a fall in the bathtub that occurred on Sunday.  Patient did not hit her head and does not take blood thinners.  She thought she could tough it out at home but feels like her pain is getting worse which is what brought her in today.  She reports pain when taking deep breaths and when walking.     Physical Exam   Triage Vital Signs: ED Triage Vitals  Encounter Vitals Group     BP 04/16/23 1909 129/82     Systolic BP Percentile --      Diastolic BP Percentile --      Pulse Rate 04/16/23 1909 76     Resp 04/16/23 1909 17     Temp 04/16/23 1909 97.7 F (36.5 C)     Temp Source 04/16/23 1909 Oral     SpO2 04/16/23 1909 100 %     Weight 04/16/23 1905 160 lb (72.6 kg)     Height 04/16/23 1905 5\' 4" (1.626 m)     Head Circumference --      Peak Flow --      Pain Score 04/16/23 2047 9     Pain Loc --      Pain Education --      Exclude from Growth Chart --     Most recent vital signs: Vitals:   04/16/23 1909 04/16/23 2050  BP: 129/82 133/77  Pulse: 76 68  Resp: 17 16  Temp: 97.7 F (36.5 C) 98.1 F (36.7 C)  SpO2: 100% 98%   General: Awake, no distress.  CV:  Good peripheral perfusion.  RRR. Resp:  Normal effort.  CTAB Abd:  No distention.  Tender to palpation along the right ribs with some overlying bruising. Other:  Bruising over the right hip, tender to palpation.   ED Results / Procedures / Treatments   Labs (all labs ordered are listed, but only abnormal results are displayed) Labs Reviewed - No data to display   RADIOLOGY  Right hand x-ray, chest x-ray and right hip x-ray obtained.  I interpreted the  images as well as reviewed the radiologist report which were all negative for any acute abnormalities.  I am concerned about a hip fracture and rib fractures which are better reviewed with a CT scan so I obtained CT chest and CT of the right hip.   PROCEDURES:  Critical Care performed: No  Procedures   MEDICATIONS ORDERED IN ED: Medications  lidocaine (LIDODERM) 5 % 1 patch (has no administration in time range)  HYDROcodone-acetaminophen (NORCO/VICODIN) 5-325 MG per tablet 1 tablet (1 tablet Oral Given 04/16/23 2114)     IMPRESSION / MDM / ASSESSMENT AND PLAN / ED COURSE  I reviewed the triage vital signs and the nursing notes.                             72  year old female presents for evaluation of right rib pain, right hip pain and right hand pain after a fall a few days ago.  Vital signs are stable patient in mild distress on  exam.  Differential diagnosis includes, but is not limited to, hip fracture, rib fracture, finger fracture, contusion, pneumothorax, abrasion, hemothorax.  Patient's presentation is most consistent with acute complicated illness / injury requiring diagnostic workup.  X-rays of the right hand, right hip and chest were all negative.  Since chest x-ray and hip x-rays are not always sensitive for fractures I did get CT imaging.  CT of the chest shows a nondisplaced fracture of the right rib, but no pneumothorax or associated effusion.  CT of the hip was negative for fracture.  Patient was educated on pain management using over-the-counter pain meds as well as topical pain medications.  I will also send her with a few days of Norco.  She was given return precautions.  She voiced understanding, all questions were answered and she was stable at discharge.    FINAL CLINICAL IMPRESSION(S) / ED DIAGNOSES   Final diagnoses:  Closed fracture of one rib of right side, initial encounter     Rx / DC Orders   ED Discharge Orders          Ordered    lidocaine  (LIDODERM) 5 %  Every 12 hours        04/16/23 2206    HYDROcodone-acetaminophen (NORCO/VICODIN) 5-325 MG tablet  Every 6 hours PRN        04/16/23 2206             Note:  This document was prepared using Dragon voice recognition software and may include unintentional dictation errors.   Rosalee Kaufman 04/16/23 2209    Pilar Jarvis, MD 04/16/23 2223    Cameron Ali, PA-C 04/16/23 2316    Cameron Ali, PA-C 04/16/23 2317    Pilar Jarvis, MD 04/22/23 [id number]

## 2023-04-16 NOTE — ED Triage Notes (Signed)
Pt fell in the bathtub on Sunday and now has right rib, right hip, and right middle finger pain.Pt denies hitting her head and denies blood thinners. Pt since the fall.

## 2023-04-22 ENCOUNTER — Ambulatory Visit: Payer: Medicare Other | Admitting: Dermatology

## 2023-07-12 ENCOUNTER — Other Ambulatory Visit: Payer: Self-pay | Admitting: Gastroenterology

## 2023-07-12 DIAGNOSIS — R1310 Dysphagia, unspecified: Secondary | ICD-10-CM

## 2023-07-15 ENCOUNTER — Encounter: Payer: Self-pay | Admitting: Dermatology

## 2023-07-15 ENCOUNTER — Ambulatory Visit (INDEPENDENT_AMBULATORY_CARE_PROVIDER_SITE_OTHER): Payer: Medicare Other | Admitting: Dermatology

## 2023-07-15 DIAGNOSIS — R21 Rash and other nonspecific skin eruption: Secondary | ICD-10-CM | POA: Diagnosis not present

## 2023-07-15 DIAGNOSIS — M1990 Unspecified osteoarthritis, unspecified site: Secondary | ICD-10-CM | POA: Insufficient documentation

## 2023-07-15 DIAGNOSIS — G63 Polyneuropathy in diseases classified elsewhere: Secondary | ICD-10-CM | POA: Insufficient documentation

## 2023-07-15 DIAGNOSIS — J45909 Unspecified asthma, uncomplicated: Secondary | ICD-10-CM | POA: Insufficient documentation

## 2023-07-15 DIAGNOSIS — L720 Epidermal cyst: Secondary | ICD-10-CM | POA: Diagnosis not present

## 2023-07-15 DIAGNOSIS — G4730 Sleep apnea, unspecified: Secondary | ICD-10-CM | POA: Insufficient documentation

## 2023-07-15 DIAGNOSIS — E559 Vitamin D deficiency, unspecified: Secondary | ICD-10-CM | POA: Insufficient documentation

## 2023-07-15 DIAGNOSIS — R3129 Other microscopic hematuria: Secondary | ICD-10-CM | POA: Insufficient documentation

## 2023-07-15 DIAGNOSIS — D649 Anemia, unspecified: Secondary | ICD-10-CM | POA: Insufficient documentation

## 2023-07-15 DIAGNOSIS — Z8669 Personal history of other diseases of the nervous system and sense organs: Secondary | ICD-10-CM | POA: Insufficient documentation

## 2023-07-15 DIAGNOSIS — J309 Allergic rhinitis, unspecified: Secondary | ICD-10-CM | POA: Insufficient documentation

## 2023-07-15 NOTE — Progress Notes (Addendum)
   New Patient Visit   Subjective  Desiree Ramirez is a 73 y.o. female who presents for the following: Rash  Breaking out all over, from top of scalp down to legs. Patient advises it has been going on for > a year and that she has always had a problem. Patient uses over the counter anti-biotic/pain reliever. Sometimes itches, sometimes stings. Patient has started using Safeguard soap which she thinks has helped. No new medications.  The following portions of the chart were reviewed this encounter and updated as appropriate: medications, allergies, medical history  Review of Systems:  No other skin or systemic complaints except as noted in HPI or Assessment and Plan.  Objective  Well appearing patient in no apparent distress; mood and affect are within normal limits.  A focused examination was performed of the following areas: Scalp, neck, back, abdomen, chest, arms, legs  Relevant exam findings are noted in the Assessment and Plan.    Assessment & Plan   RASH   MILIA    RASH Exam: pink lichenified plaques B/L neck. Numerous scattered active and healed excoriations on lower back, abdomen, lower legs  Chronic flaring not at goal  05/08/23 CMP no evidence of renal or hepatic dysfunction that would explain symptoms  02/06/23 no abnormal blood counts that would explain symptoms. Normal TSH  Treatment Plan: Neck lesions consistent with prurigo nodules/LSC. Other lesions are excoriations; thus no primary rash is appreciated. Discussed starting Dupixent for atopic neurodermatitis. If not effective, would recommend discussing symptoms with neurologist. Patient already sees a neurologist and opts to discuss with them first Patient gets white bumps around eyelids and rubs them off. May be milia. None on exam today Return if new rash develops   Return if symptoms worsen or fail to improve.  Anise Salvo, RMA, am acting as scribe for Elie Goody, MD  .   Documentation: I have reviewed the above documentation for accuracy and completeness, and I agree with the above.  Elie Goody, MD

## 2023-07-15 NOTE — Patient Instructions (Signed)
 Recommend consult with neurologist, consider gabapentin.   Due to recent changes in healthcare laws, you may see results of your pathology and/or laboratory studies on MyChart before the doctors have had a chance to review them. We understand that in some cases there may be results that are confusing or concerning to you. Please understand that not all results are received at the same time and often the doctors may need to interpret multiple results in order to provide you with the best plan of care or course of treatment. Therefore, we ask that you please give [place] 2 business days to thoroughly review all your results before contacting the office for clarification. Should we see a critical lab result, you will be contacted sooner.   If You Need Anything After Your Visit  If you have any questions or concerns for your doctor, please call our main line at [phone] and press option 4 to reach your doctor's medical assistant. If no one answers, please leave a voicemail as directed and we will return your call as soon as possible. Messages left after 4 pm will be answered the following business day.   You may also send [place] a message via MyChart. We typically respond to MyChart messages within 1-2 business days.  For prescription refills, please ask your pharmacy to contact our office. Our fax number is [phone].  If you have an urgent issue when the clinic is closed that cannot wait until the next business day, you can page your doctor at the number below.    Please note that while we do our best to be available for urgent issues outside of office hours, we are not available 24/7.   If you have an urgent issue and are unable to reach [place], you may choose to seek medical care at your doctor's office, retail clinic, urgent care center, or emergency room.  If you have a medical emergency, please immediately call 911 or go to the emergency department.  Pager Numbers  - Dr. Gwen Pounds:  [phone]  - Dr. Roseanne Reno: [phone]  - Dr. Katrinka Blazing: [phone]   In the event of inclement weather, please call our main line at [phone] for an update on the status of any delays or closures.  Dermatology Medication Tips: Please keep the boxes that topical medications come in in order to help keep track of the instructions about where and how to use these. Pharmacies typically print the medication instructions only on the boxes and not directly on the medication tubes.   If your medication is too expensive, please contact our office at [phone] option 4 or send [place] a message through MyChart.   We are unable to tell what your co-pay for medications will be in advance as this is different depending on your insurance coverage. However, we may be able to find a substitute medication at lower cost or fill out paperwork to get insurance to cover a needed medication.   If a prior authorization is required to get your medication covered by your insurance company, please allow [place] 1-2 business days to complete this process.  Drug prices often vary depending on where the prescription is filled and some pharmacies may offer cheaper prices.  The website www.goodrx.com contains coupons for medications through different pharmacies. The prices here do not account for what the cost may be with help from insurance (it may be cheaper with your insurance), but the website can give you the price if you did not use any insurance.  - You  can print the associated coupon and take it with your prescription to the pharmacy.  - You may also stop by our office during regular business hours and pick up a GoodRx coupon card.  - If you need your prescription sent electronically to a different pharmacy, notify our office through [facility] or by phone at [phone] option 4.     Si Usted Necesita Algo Despus de Su Visita  Tambin puede enviarnos un mensaje a travs de Clinical cytogeneticist. Por lo general  respondemos a los mensajes de MyChart en el transcurso de 1 a 2 das hbiles.  Para renovar recetas, por favor pida a su farmacia que se ponga en contacto con nuestra oficina. Annie Sable de fax es [place] [phone].  Si tiene un asunto urgente cuando la clnica est cerrada y que no puede esperar hasta el siguiente da hbil, puede llamar/localizar a su doctor(a) al nmero que aparece a continuacin.   Por favor, tenga en cuenta que aunque hacemos todo lo posible para estar disponibles para asuntos urgentes fuera del horario de [place], no estamos disponibles las 24 horas del da, los 7 [address]  [address] de la [place].   Si tiene un problema urgente y no puede comunicarse con nosotros, puede optar por buscar atencin mdica  en el consultorio de su doctor(a), en una clnica privada, en un centro de atencin urgente o en una sala de emergencias.  Si tiene Engineer, drilling, por favor llame inmediatamente al 911 o vaya a la sala de emergencias.  Nmeros de bper  - Dr. Gwen Pounds: [phone]  - Dra. Roseanne Reno: [phone]  - Dr. Katrinka Blazing: [phone]   En caso de inclemencias del tiempo, por favor llame a Lacy Duverney principal al [phone] para una actualizacin sobre el [place] de cualquier retraso o cierre.  Consejos para la medicacin en dermatologa: Por favor, guarde las cajas en las que vienen los medicamentos de uso tpico para ayudarle a seguir las instrucciones sobre dnde y cmo usarlos. Las farmacias generalmente imprimen las instrucciones del medicamento slo en las cajas y no directamente en los tubos del [place].   Si su medicamento es muy caro, por favor, pngase en contacto con Rolm Gala llamando al [phone] y presione la opcin 4 o envenos un mensaje a travs de Clinical cytogeneticist.   No podemos decirle cul ser su copago por los medicamentos por adelantado ya que esto es diferente dependiendo de la cobertura de su seguro. Sin embargo, es posible que podamos encontrar un  medicamento sustituto a Audiological scientist un formulario para que el seguro cubra el medicamento que se considera necesario.   Si se requiere una autorizacin previa para que su compaa de seguros [place] su medicamento, por favor permtanos de 1 a 2 das hbiles para completar [address]  Los precios de los medicamentos varan con frecuencia dependiendo del Environmental consultant de dnde se surte la receta y alguna farmacias pueden ofrecer precios ms baratos.  El sitio web www.goodrx.com tiene cupones para medicamentos de Health and safety inspector. Los precios aqu no tienen en cuenta lo que podra costar con la ayuda del seguro (puede ser ms barato con su seguro), pero el sitio web puede darle el precio si no utiliz Tourist information centre manager.  - Puede imprimir el cupn correspondiente y llevarlo con su receta a la farmacia.  - Tambin puede pasar por nuestra oficina durante el horario de atencin regular y Education officer, museum una tarjeta de cupones de GoodRx.  - Si necesita que su receta se enve electrnicamente a una farmacia diferente, informe  a nuestra oficina a travs de MyChart de [place] o por telfono llamando al [phone] y presione la opcin 4.

## 2023-08-01 ENCOUNTER — Ambulatory Visit: Payer: Self-pay

## 2023-08-01 DIAGNOSIS — K640 First degree hemorrhoids: Secondary | ICD-10-CM | POA: Diagnosis not present

## 2023-08-01 DIAGNOSIS — K449 Diaphragmatic hernia without obstruction or gangrene: Secondary | ICD-10-CM | POA: Diagnosis not present

## 2023-08-01 DIAGNOSIS — K635 Polyp of colon: Secondary | ICD-10-CM | POA: Diagnosis not present

## 2023-08-01 DIAGNOSIS — K2980 Duodenitis without bleeding: Secondary | ICD-10-CM | POA: Diagnosis not present

## 2023-08-01 DIAGNOSIS — K5730 Diverticulosis of large intestine without perforation or abscess without bleeding: Secondary | ICD-10-CM | POA: Diagnosis not present

## 2023-08-08 ENCOUNTER — Ambulatory Visit
Admission: RE | Admit: 2023-08-08 | Discharge: 2023-08-08 | Disposition: A | Discharge status: Home / Self Care | Source: Ambulatory Visit | Attending: Gastroenterology | Admitting: Gastroenterology

## 2023-08-08 DIAGNOSIS — R1310 Dysphagia, unspecified: Secondary | ICD-10-CM

## 2023-10-23 ENCOUNTER — Ambulatory Visit
Admission: RE | Admit: 2023-10-23 | Discharge: 2023-10-23 | Disposition: A | Discharge status: Home / Self Care | Source: Ambulatory Visit | Attending: Otolaryngology | Admitting: Otolaryngology

## 2023-10-23 DIAGNOSIS — E041 Nontoxic single thyroid nodule: Secondary | ICD-10-CM

## 2023-10-30 ENCOUNTER — Other Ambulatory Visit: Payer: Self-pay | Admitting: Otolaryngology

## 2023-10-30 DIAGNOSIS — E041 Nontoxic single thyroid nodule: Secondary | ICD-10-CM

## 2023-11-05 NOTE — Progress Notes (Signed)
 Patient for US  guided FNA RT Inferior thyroid  nodule biopsy on Wed 11/06/23, I called and spoke with the patient on the phone and gave pre-procedure instructions. Pt was made aware to be here at 12:30p and check in at the [facility] registration desk. Pt stated understanding.  Called 11/05/23

## 2023-11-06 ENCOUNTER — Ambulatory Visit
Admission: RE | Admit: 2023-11-06 | Discharge: 2023-11-06 | Disposition: A | Discharge status: Home / Self Care | Source: Ambulatory Visit | Attending: Otolaryngology | Admitting: Otolaryngology

## 2023-11-06 DIAGNOSIS — E041 Nontoxic single thyroid nodule: Secondary | ICD-10-CM | POA: Insufficient documentation

## 2023-11-06 MED ORDER — LIDOCAINE HCL (PF) 1 % IJ SOLN
5.0000 mL | Freq: Once | INTRAMUSCULAR | Status: AC
  Administered 2023-11-06: 5 mL via INTRADERMAL
  Filled 2023-11-06: qty 5

## 2023-11-07 LAB — CYTOLOGY - NON PAP

## 2024-02-11 ENCOUNTER — Encounter (HOSPITAL_BASED_OUTPATIENT_CLINIC_OR_DEPARTMENT_OTHER): Payer: Self-pay | Admitting: Student

## 2024-02-11 ENCOUNTER — Ambulatory Visit (INDEPENDENT_AMBULATORY_CARE_PROVIDER_SITE_OTHER)

## 2024-02-11 ENCOUNTER — Ambulatory Visit (INDEPENDENT_AMBULATORY_CARE_PROVIDER_SITE_OTHER): Admitting: Student

## 2024-02-11 ENCOUNTER — Other Ambulatory Visit (HOSPITAL_BASED_OUTPATIENT_CLINIC_OR_DEPARTMENT_OTHER): Payer: Self-pay

## 2024-02-11 DIAGNOSIS — M79641 Pain in right hand: Secondary | ICD-10-CM

## 2024-02-11 DIAGNOSIS — M25511 Pain in right shoulder: Secondary | ICD-10-CM

## 2024-02-11 DIAGNOSIS — M79601 Pain in right arm: Secondary | ICD-10-CM | POA: Diagnosis not present

## 2024-02-11 MED ORDER — TRIAMCINOLONE ACETONIDE 40 MG/ML IJ SUSP
2.0000 mL | INTRAMUSCULAR | Status: AC | PRN
  Administered 2024-02-11: 2 mL via INTRA_ARTICULAR

## 2024-02-11 MED ORDER — LIDOCAINE HCL 1 % IJ SOLN
4.0000 mL | INTRAMUSCULAR | Status: AC | PRN
  Administered 2024-02-11: 4 mL

## 2024-02-11 NOTE — Progress Notes (Signed)
 Chief Complaint: Right shoulder pain    Discussed the use of AI scribe software for clinical note transcription with the patient, who gave verbal consent to proceed.  History of Present Illness Desiree Ramirez is a pleasant 73 year old female who presents with right shoulder pain. She has experienced right shoulder pain for two to three weeks, beginning after shaking a throw rug. The pain is localized to the anterior and lateral shoulder and worsens with arm lifting or rotation. Initially improving, the pain intensified after cleaning activities. The pain is severe enough to interfere with her physical therapy for balance issues. Topical menthol and ibuprofen have been ineffective. The pain is particularly bothersome at night, especially when lying on her right side, requiring careful arm positioning. Being right-handed, the pain complicates daily activities. She has no history of previous shoulder problems or similar symptoms. Pain occurs with certain arm movements.  She is diabetic with last A1c 6 months ago of 7.4.   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 Viviane Drone   Osteoarthritis    with history of whiplash   Sleep apnea    AHI 43.6, CPAP of 11 initiated   Vitamin D deficiency    Past Surgical History:  Procedure Laterality Date   ABDOMINAL HYSTERECTOMY     TAH/BSO with  uterine enlargement, fibroids   CARPAL TUNNEL RELEASE Bilateral    CATARACT EXTRACTION Bilateral    COLONOSCOPY  10/16/2005   adenomatous polyp   COLONOSCOPY WITH PROPOFOL  N/A 05/27/2015   Procedure: COLONOSCOPY WITH PROPOFOL ;  Surgeon: Lamar ONEIDA Holmes, MD;  Location: [facility] ENDOSCOPY;  Service: Endoscopy;  Laterality: N/A;   ESOPHAGOGASTRODUODENOSCOPY (EGD) WITH PROPOFOL  N/A 05/27/2015   Procedure: ESOPHAGOGASTRODUODENOSCOPY (EGD) WITH PROPOFOL ;  Surgeon: Lamar ONEIDA Holmes, MD;  Location[facility] ENDOSCOPY;  Service: Endoscopy;  Laterality: N/A;   INGUINAL HERNIA REPAIR Right 06/17/2015   Procedure: HERNIA REPAIR INGUINAL ADULT;  Surgeon: Larinda Unknown Sharps, MD;  Location: [facility] ORS;  Service: General;  Laterality: Right;   Social History   Socioeconomic History   Marital status: Married    Spouse name: Not on file   Number of children: Not on file   Years of education: Not on file   Highest education level: Not on file  Occupational History   Not on file  Tobacco Use   Smoking status: Never   Smokeless tobacco: Never  Vaping Use   Vaping status: Never Used  Substance and Sexual Activity   Alcohol use: Yes    Alcohol/week: 0.0 - 1.0 standard drinks of alcohol    Comment: special occasions   Drug use: No   Sexual  activity: Not Currently  Other Topics Concern   Not on file  Social History Narrative   Not on file   Social Drivers of Health   Financial Resource Strain: Low Risk  (02/11/2023)   Received from [facility]   Overall Financial Resource Strain (CARDIA)    Difficulty of Paying Living Expenses: Not hard at all  Food Insecurity: No Food Insecurity (02/11/2023)   Received from [facility]   Hunger Vital Sign    Within the past 12 months, the food you bought just didn't last and you didn't have money to get more.: Never true    Within the past 12 months, you worried that your food would run out before you got the money to buy more.: Never  true  Transportation Needs: No Transportation Needs (02/11/2023)   Received from [facility]   PRAPARE - Transportation    Lack of Transportation (Non-Medical): No    In the past 12 months, has lack of transportation kept you from medical appointments or from getting medications?: No  Physical Activity: Not on file  Stress: Not on file  Social Connections: Not on file   Family History  Problem Relation Age of Onset   Diabetes Mother    Diabetes Brother    Breast cancer Neg Hx    Allergies  Allergen Reactions   Metformin Diarrhea   Triptans Other (See Comments)    hyperactivity   Amoxicillin Rash   Penicillin V Potassium Hives and Rash    Has patient had a PCN reaction causing immediate rash, facial/tongue/throat  swelling, SOB or lightheadedness with hypotension: yes Has patient had a PCN reaction causing severe rash involving mucus membranes or skin necrosis: no Has patient had a PCN reaction that required hospitalization no Has patient had a PCN reaction occurring within the last 10 years: no If all of the above answers are NO, then may proceed with Cephalosporin use.    Current Outpatient Medications  Medication Sig Dispense Refill   albuterol  (VENTOLIN  HFA) 108 (90 BASE) MCG/ACT inhaler INHALE TWO PUFFS BY MOUTH 4 TIMES DAILY AS NEEDED FOR WHEEZING     amphetamine-dextroamphetamine (ADDERALL) 15 MG tablet Take 15 mg by mouth 2 (two) times daily.      ARNUITY ELLIPTA 200 MCG/ACT AEPB Inhale 1 puff into the lungs daily.     atorvastatin (LIPITOR) 80 MG tablet Take 1 tablet by mouth daily.     buPROPion (WELLBUTRIN SR) 150 MG 12 hr tablet Take 150 mg by mouth daily.      clobetasol ointment (TEMOVATE) 0.05 % APPLY TO AFFECTED AREAS TOPICALLY TWO TIMES PER WEEK     estradiol (ESTRACE) 0.5 MG tablet Take 0.5 mg by mouth daily.     FARXIGA 10 MG TABS tablet Take 10 mg by mouth daily. (Patient not taking: Reported on 07/15/2023)     FLUoxetine (PROZAC) 40 MG  capsule Take 40 mg by mouth daily.     Fluticasone-Salmeterol (ADVAIR DISKUS) 250-50 MCG/DOSE AEPB INHALE ONE DOSE BY MOUTH TWICE DAILY (Patient not taking: Reported on 07/15/2023)     glipiZIDE (GLUCOTROL XL) 2.5 MG 24 hr tablet Take by mouth.     hydrochlorothiazide (HYDRODIURIL) 12.5 MG tablet Take 1 tablet by mouth daily.     losartan (COZAAR) 50 MG tablet Take 1 tablet by mouth daily.     metroNIDAZOLE (METROGEL) 0.75 % gel APPLY TOPICALLY TWICE DAILY AS NEEDED FOR RASH     Multiple Vitamin (MULTI-VITAMIN) tablet  Take 1 tablet by mouth daily.     mupirocin ointment (BACTROBAN) 2 % Apply topically.     pantoprazole (PROTONIX) 40 MG tablet Take by mouth.     rOPINIRole (REQUIP) 0.5 MG tablet Take by mouth.     SAPHRIS 5 MG SUBL 24 hr tablet Place 5 mg under the tongue at bedtime. (Patient not taking: Reported on 07/15/2023)  0   sertraline (ZOLOFT) 100 MG tablet Take 200 mg by mouth daily.  (Patient not taking: Reported on 07/15/2023)     simvastatin (ZOCOR) 80 MG tablet TAKE ONE TABLET BY MOUTH AT BEDTIME (Patient not taking: Reported on 07/15/2023)     solifenacin (VESICARE) 10 MG tablet Take 10 mg by mouth daily. (Patient not taking: Reported on 07/15/2023)     traZODone (DESYREL) 100 MG tablet Take by mouth.     No current facility-administered medications for this visit.   No results found.  Review of Systems:   A ROS was performed including pertinent positives and negatives as documented in the HPI.  Physical Exam :   Constitutional: NAD and appears stated age Neurological: Alert and oriented Psych: Appropriate affect and cooperative There were no vitals taken for this visit.   Comprehensive Musculoskeletal Exam:    Exam of the right shoulder demonstrates tenderness over the lateral deltoid and bicipital groove.  Active range of motion is to 160 degrees forward flexion, 40 degrees external rotation, and internal rotation to L2 bilaterally.  Pain with speeds and empty can.  Negative  impingement tests.  Imaging:   Xray (right shoulder 3 views): Mild to moderate glenohumeral osteoarthritis with osteophyte formation.  Mild degenerative changes within the [facility] joint.  No evidence of acute fracture or dislocation.   I personally reviewed and interpreted the radiographs.      Assessment & Plan Right shoulder pain Patient is experiencing 3-week history of atraumatic right shoulder pain.  Location of pain and physical exam are suggestive of rotator cuff tendinopathy versus biceps tendinitis.  X-rays do also show evidence of mild to moderate degenerative changes within the glenohumeral joint.  She has failed to get any relief from conservative measures including ibuprofen and topicals.  At this point her symptoms are interfering with her daily activities and ability to continue physical therapy for balance.  I have recommended a glenohumeral cortisone injection for symptom relief which patient is agreeable to.  Injection was performed successfully under ultrasound guidance and she tolerated this well.  Discussed rotator cuff strengthening home exercises she can perform with a resistance band.  Could still consider trial of a subacromial injection if symptoms persist. Blood sugar levels should be monitored closely post-injection due to potential elevations.      Procedure Note  Patient: Desiree Ramirez             Date of Birth: [birth year]           MRN: [id number]             Visit Date: 02/11/2024  Procedures: Visit Diagnoses:  1. Acute pain of right shoulder     Large Joint Inj: R glenohumeral on 02/11/2024 1:53 PM Indications: pain Details: 22 G 1.5 in needle, ultrasound-guided anterior approach Medications: 4 mL lidocaine  1 %; 2 mL triamcinolone acetonide 40 MG/ML Outcome: tolerated well, no immediate complications Procedure, treatment alternatives, risks and benefits explained, specific risks discussed. Consent was given by the patient. Immediately prior to  procedure a time out was called to verify the correct patient, procedure,  equipment, support staff and site/side marked as required. Patient was prepped and draped in the usual sterile fashion.       I personally saw and evaluated the patient, and participated in the management and treatment plan.  Leonce Reveal, PA-C Orthopedics

## 2024-02-24 ENCOUNTER — Encounter: Payer: Self-pay | Admitting: Radiology
